# Patient Record
Sex: Male | Born: 1955 | Race: White | Hispanic: No | Marital: Married | Smoking: Former smoker
Health system: Southern US, Community
[De-identification: ages and names within clinical notes are randomized; demographics above are authoritative.]

## PROBLEM LIST (undated history)

## (undated) DIAGNOSIS — R011 Cardiac murmur, unspecified: Secondary | ICD-10-CM

## (undated) DIAGNOSIS — K429 Umbilical hernia without obstruction or gangrene: Secondary | ICD-10-CM

## (undated) DIAGNOSIS — G4730 Sleep apnea, unspecified: Secondary | ICD-10-CM

## (undated) DIAGNOSIS — Z860100 Personal history of colon polyps, unspecified: Secondary | ICD-10-CM

## (undated) DIAGNOSIS — Z8619 Personal history of other infectious and parasitic diseases: Secondary | ICD-10-CM

## (undated) DIAGNOSIS — G4733 Obstructive sleep apnea (adult) (pediatric): Secondary | ICD-10-CM

## (undated) DIAGNOSIS — Z973 Presence of spectacles and contact lenses: Secondary | ICD-10-CM

## (undated) DIAGNOSIS — H9191 Unspecified hearing loss, right ear: Secondary | ICD-10-CM

## (undated) DIAGNOSIS — R3912 Poor urinary stream: Secondary | ICD-10-CM

## (undated) DIAGNOSIS — Z87820 Personal history of traumatic brain injury: Secondary | ICD-10-CM

## (undated) DIAGNOSIS — E119 Type 2 diabetes mellitus without complications: Secondary | ICD-10-CM

## (undated) HISTORY — PX: GANGLION CYST EXCISION: SHX1691

## (undated) HISTORY — DX: Sleep apnea, unspecified: G47.30

## (undated) HISTORY — PX: ROOT CANAL: SHX2363

## (undated) HISTORY — PX: TONSILLECTOMY: SUR1361

## (undated) HISTORY — DX: Personal history of other infectious and parasitic diseases: Z86.19

## (undated) HISTORY — PX: TONSILLECTOMY AND ADENOIDECTOMY: SUR1326

## (undated) HISTORY — PX: VASECTOMY: SHX75

## (undated) HISTORY — DX: Cardiac murmur, unspecified: R01.1

---
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
## 2005-06-30 ENCOUNTER — Ambulatory Visit: Payer: Self-pay | Admitting: Family Medicine

## 2005-07-03 ENCOUNTER — Ambulatory Visit: Payer: Self-pay | Admitting: Family Medicine

## 2005-07-24 ENCOUNTER — Ambulatory Visit: Payer: Self-pay | Admitting: Gastroenterology

## 2005-08-04 ENCOUNTER — Ambulatory Visit: Payer: Self-pay | Admitting: Gastroenterology

## 2007-06-29 ENCOUNTER — Ambulatory Visit: Payer: Self-pay | Admitting: Family Medicine

## 2007-07-15 LAB — CONVERTED CEMR LAB
Alkaline Phosphatase: 72 units/L (ref 39–117)
Bilirubin, Direct: 0.1 mg/dL (ref 0.0–0.3)
CO2: 30 meq/L (ref 19–32)
GFR calc Af Amer: 90 mL/min
Glucose, Bld: 100 mg/dL — ABNORMAL HIGH (ref 70–99)
HDL: 33.1 mg/dL — ABNORMAL LOW (ref >39.0)
LDL Cholesterol: 103 mg/dL — ABNORMAL HIGH (ref 0–99)
Lymphocytes Relative: 25.6 % (ref 12.0–46.0)
Monocytes Absolute: 0.7 10*3/uL (ref 0.1–1.0)
Monocytes Relative: 11.7 % (ref 3.0–12.0)
Neutrophils Relative %: 60.5 % (ref 43.0–77.0)
Platelets: 248 10*3/uL (ref 150–400)
Potassium: 4.2 meq/L (ref 3.5–5.1)
RDW: 12.4 % (ref 11.5–14.6)
Sodium: 142 meq/L (ref 135–145)
Total CHOL/HDL Ratio: 4.5
Total Protein: 6.4 g/dL (ref 6.0–8.3)
Triglycerides: 68 mg/dL (ref 0–149)
VLDL: 14 mg/dL (ref 0–40)

## 2007-09-26 ENCOUNTER — Ambulatory Visit: Payer: Self-pay | Admitting: Family Medicine

## 2007-09-26 DIAGNOSIS — R011 Cardiac murmur, unspecified: Secondary | ICD-10-CM

## 2007-09-26 DIAGNOSIS — J309 Allergic rhinitis, unspecified: Secondary | ICD-10-CM | POA: Insufficient documentation

## 2007-09-26 DIAGNOSIS — J342 Deviated nasal septum: Secondary | ICD-10-CM

## 2007-11-04 ENCOUNTER — Encounter: Payer: Self-pay | Admitting: Family Medicine

## 2007-11-27 ENCOUNTER — Ambulatory Visit (HOSPITAL_BASED_OUTPATIENT_CLINIC_OR_DEPARTMENT_OTHER): Admission: RE | Admit: 2007-11-27 | Discharge: 2007-11-27 | Payer: Self-pay | Admitting: Otolaryngology

## 2007-12-03 ENCOUNTER — Ambulatory Visit: Payer: Self-pay | Admitting: Internal Medicine

## 2008-01-31 ENCOUNTER — Encounter: Payer: Self-pay | Admitting: Family Medicine

## 2008-08-24 ENCOUNTER — Ambulatory Visit: Payer: Self-pay | Admitting: Family Medicine

## 2008-08-24 DIAGNOSIS — M546 Pain in thoracic spine: Secondary | ICD-10-CM

## 2009-05-13 ENCOUNTER — Ambulatory Visit: Payer: Self-pay | Admitting: Family Medicine

## 2009-05-13 DIAGNOSIS — J1189 Influenza due to unidentified influenza virus with other manifestations: Secondary | ICD-10-CM | POA: Insufficient documentation

## 2009-07-08 ENCOUNTER — Telehealth: Payer: Self-pay | Admitting: Family Medicine

## 2009-12-18 ENCOUNTER — Encounter: Payer: Self-pay | Admitting: Family Medicine

## 2010-04-22 NOTE — Miscellaneous (Signed)
Summary: Flu Shot/[facility]  Flu Shot/[facility]   Imported By: Maryln Gottron 01/01/2010 12:27:04  _____________________________________________________________________  External Attachment:    Type:   Image     Comment:   External Document

## 2010-04-22 NOTE — Progress Notes (Signed)
Summary: Pt req med called in for insect bites to [facility]   Phone Note Call from Patient Call back at Work Phone [phone]   Caller: Patient Summary of Call: Pt called and says that he has insect bites all over for about 2 weeks now. Pt is req med called in to [facility] [place] [facility] [place].  Initial call taken by: Lucy Antigua,  July 08, 2009 2:07 PM  Follow-up for Phone Call        this needs to be seen and evaluated.  Try Benadryl, both orally and topically, as needed. make an OV soon Follow-up by: Nelwyn Salisbury MD,  July 08, 2009 4:55 PM  Additional Follow-up for Phone Call Additional follow up Details #1::        Phone Call Completed; [facility] Additional Follow-up by: Raechel Ache, RN,  July 08, 2009 5:03 PM

## 2010-04-22 NOTE — Assessment & Plan Note (Signed)
Summary: cough/congestion/cjr   Vital Signs:  Patient profile:   55 year old male Weight:      211 pounds Temp:     96.8 degrees F oral BP sitting:   122 / 84  (left arm) Cuff size:   large  Vitals Entered By: Alfred Levins, CMA (May 13, 2009 11:09 AM) CC: cough, congestion   History of Present Illness: Here with his 15 yr old son, who has the same symptom complex. Devarius has had 2 days of body aches, fevers, ST, and dry cough. No NVD. Drinking fluids and taking Motrin.   Allergies (verified): No Known Drug Allergies  Past History:  Past Medical History: Reviewed history from 09/26/2007 and no changes required. Allergic rhinitis Chickenpox Heart Murmur  Review of Systems  The patient denies anorexia, weight loss, weight gain, vision loss, decreased hearing, hoarseness, chest pain, syncope, dyspnea on exertion, peripheral edema, hemoptysis, abdominal pain, melena, hematochezia, severe indigestion/heartburn, hematuria, incontinence, genital sores, muscle weakness, suspicious skin lesions, transient blindness, difficulty walking, depression, unusual weight change, abnormal bleeding, enlarged lymph nodes, angioedema, breast masses, and testicular masses.    Physical Exam  General:  Well-developed,well-nourished,in no acute distress; alert,appropriate and cooperative throughout examination Head:  Normocephalic and atraumatic without obvious abnormalities. No apparent alopecia or balding. Eyes:  No corneal or conjunctival inflammation noted. EOMI. Perrla. Funduscopic exam benign, without hemorrhages, exudates or papilledema. Vision grossly normal. Ears:  External ear exam shows no significant lesions or deformities.  Otoscopic examination reveals clear canals, tympanic membranes are intact bilaterally without bulging, retraction, inflammation or discharge. Hearing is grossly normal bilaterally. Nose:  External nasal examination shows no deformity or inflammation. Nasal mucosa are  pink and moist without lesions or exudates. Mouth:  Oral mucosa and oropharynx without lesions or exudates.  Teeth in good repair. Neck:  No deformities, masses, or tenderness noted. Lungs:  Normal respiratory effort, chest expands symmetrically. Lungs are clear to auscultation, no crackles or wheezes.   Impression & Recommendations:  Problem # 1:  INFLUENZA (ICD-487.8)  Complete Medication List: 1)  Nasonex 50 Mcg/act Susp (Mometasone furoate) .... 2  sprays each nostril once daily 2)  Diclofenac Sodium 50 Mg Tbec (Diclofenac sodium) .... Three times a day as needed pain 3)  Flexeril 10 Mg Tabs (Cyclobenzaprine hcl) .... Three times a day as needed spasm 4)  Tamiflu 75 Mg Caps (Oseltamivir phosphate) .... Two times a day  Patient Instructions: 1)  Please schedule a follow-up appointment as needed .  Prescriptions: TAMIFLU 75 MG CAPS (OSELTAMIVIR PHOSPHATE) two times a day  #10 x 0   Entered and Authorized by:   Nelwyn Salisbury MD   Signed by:   Nelwyn Salisbury MD on 05/13/2009   Method used:   Electronically to        [facility] [address] [phone]* (retail)       [address]       [place], [place]  [address]       Ph: [phone] or [phone]       Fax: [phone]   RxID:   [id number]

## 2010-08-05 NOTE — Procedures (Signed)
NAMEJONANTHAN, BOLENDER             ACCOUNT NO.:  [id number]   MEDICAL RECORD NO.:  [id number]          PATIENT TYPE:  OUT   LOCATION:  SLEEP CENTER                 FACILITY:  [facility]   PHYSICIAN:  Clinton D. Maple Hudson, MD, FCCP, FACPDATE OF BIRTH:  19-Feb-1956   DATE OF STUDY:  11/27/2007                            NOCTURNAL POLYSOMNOGRAM   REFERRING PHYSICIAN:  Lucky Cowboy, MD   INDICATION FOR STUDY:  Hypersomnia with sleep apnea.   EPWORTH SLEEPINESS SCORE:  9/24.  BMI 30.4, weight 200 pounds, height 68  inches, neck 17 inches.   HOME MEDICATIONS:  Charted and reviewed.   SLEEP ARCHITECTURE:  Split study protocol.  During the diagnostic phase,  total sleep time was 104.5 minutes with sleep efficiency 42.5%.  Stage 1  was 11%.  Stage 2 was 86.6%.  Stage 3 was 2.4%.  REM was absent.  Sleep  latency was 29 minutes.  Awake after sleep onset 112.5 minutes.  Arousal  index 29.3.  No bedtime medication was taken.   RESPIRATORY DATA:  Split study protocol.  Apnea/hypopnea index (AHI),  13.8 per hour before CPAP and 24 events were scored including 3  obstructive apneas and 21 hypopneas.  The events were not positional.  CPAP was then titrated to 9 CWP, AHI 0 per hour.  He chose a small  ResMed Quattro full-face mask with heated humidifier.   OXYGEN DATA:  Moderate snoring with oxygen desaturation before CPAP to a  Nadir of 89%.  After CPAP control, mean oxygen saturation held 94.4% on  room air.   CARDIAC DATA:  Normal sinus rhythm.   MOVEMENT/PARASOMNIA:  No significant movement disturbance.  No bathroom  trips.   IMPRESSIONS/RECOMMENDATIONS:  1. Mild obstructive sleep apnea/hypopnea syndrome with apnea/hypopnea      index 13.8 per hour with nonpositional events, moderate snoring and      oxygen desaturation to a Nadir of 89%.  2. Successful constant positive air pressure titration to 9 cm of      water pressure, with apnea/hypopnea index of 0 per hour.  He chose      a small ResMed  Quattro full-face mask with heated humidifier.      Clinton D. Maple Hudson, MD, FCCP, FACP  Diplomate, Biomedical engineer of Sleep Medicine  Electronically Signed     CDY/MEDQ  D:  12/03/2007 10:52:01  T:  12/03/2007 11:54:10  Job:  [id number]

## 2010-08-07 ENCOUNTER — Other Ambulatory Visit: Payer: Self-pay | Admitting: *Deleted

## 2010-08-07 MED ORDER — FLUTICASONE PROPIONATE 50 MCG/ACT NA SUSP: 2.0000 | Freq: Every day | NASAL | Status: DC

## 2010-08-30 ENCOUNTER — Encounter: Payer: Self-pay | Admitting: Gastroenterology

## 2010-09-03 ENCOUNTER — Other Ambulatory Visit (INDEPENDENT_AMBULATORY_CARE_PROVIDER_SITE_OTHER): Payer: BC Managed Care – PPO

## 2010-09-03 DIAGNOSIS — Z0000 Encounter for general adult medical examination without abnormal findings: Secondary | ICD-10-CM

## 2010-09-03 LAB — CBC WITH DIFFERENTIAL/PLATELET
Basophils Absolute: 0.1 10*3/uL (ref 0.0–0.1)
Eosinophils Absolute: 0.1 10*3/uL (ref 0.0–0.7)
HCT: 42.8 % (ref 39.0–52.0)
Hemoglobin: 14.6 g/dL (ref 13.0–17.0)
Lymphocytes Relative: 23.3 % (ref 12.0–46.0)
Lymphs Abs: 1.8 10*3/uL (ref 0.7–4.0)
MCHC: 34 g/dL (ref 30.0–36.0)
Monocytes Absolute: 0.8 10*3/uL (ref 0.1–1.0)
Neutro Abs: 4.9 10*3/uL (ref 1.4–7.7)
RDW: 13.6 % (ref 11.5–14.6)

## 2010-09-03 LAB — BASIC METABOLIC PANEL
CO2: 30 mEq/L (ref 19–32)
Calcium: 9.2 mg/dL (ref 8.4–10.5)
Glucose, Bld: 104 mg/dL — ABNORMAL HIGH (ref 70–99)
Sodium: 142 mEq/L (ref 135–145)

## 2010-09-03 LAB — HEPATIC FUNCTION PANEL: Albumin: 4.2 g/dL (ref 3.5–5.2)

## 2010-09-03 LAB — LIPID PANEL
Cholesterol: 168 mg/dL (ref 0–200)
HDL: 38.3 mg/dL — ABNORMAL LOW (ref >39.00)
Triglycerides: 137 mg/dL (ref 0.0–149.0)
VLDL: 27.4 mg/dL (ref 0.0–40.0)

## 2010-09-03 LAB — POCT URINALYSIS DIPSTICK
Ketones, UA: NEGATIVE
Protein, UA: NEGATIVE
Spec Grav, UA: 1.02
pH, UA: 6.5

## 2010-09-03 LAB — PSA: PSA: 0.74 ng/mL (ref 0.10–4.00)

## 2010-09-08 ENCOUNTER — Encounter: Payer: Self-pay | Admitting: *Deleted

## 2010-09-11 ENCOUNTER — Encounter: Payer: Self-pay | Admitting: Family Medicine

## 2010-09-11 ENCOUNTER — Ambulatory Visit (INDEPENDENT_AMBULATORY_CARE_PROVIDER_SITE_OTHER): Payer: BC Managed Care – PPO | Admitting: Family Medicine

## 2010-09-11 VITALS — BP 120/80 | HR 72 | Temp 98.8°F | Ht 67.0 in | Wt 215.0 lb

## 2010-09-11 DIAGNOSIS — Z136 Encounter for screening for cardiovascular disorders: Secondary | ICD-10-CM

## 2010-09-11 DIAGNOSIS — Z0000 Encounter for general adult medical examination without abnormal findings: Secondary | ICD-10-CM

## 2010-09-11 DIAGNOSIS — Z23 Encounter for immunization: Secondary | ICD-10-CM

## 2010-09-11 MED ORDER — TERBINAFINE HCL 250 MG PO TABS: 250.0000 mg | ORAL_TABLET | Freq: Every day | ORAL | Status: AC

## 2010-09-11 MED ORDER — MOMETASONE FUROATE 50 MCG/ACT NA SUSP: 2.0000 | Freq: Every day | NASAL | Status: DC

## 2010-09-11 MED ORDER — OLOPATADINE HCL 0.6 % NA SOLN: 2.0000 | Freq: Two times a day (BID) | NASAL | Status: DC

## 2010-09-11 NOTE — Progress Notes (Signed)
  Subjective:    Patient ID: Jack Pitts, male    DOB: 1956/03/08, 55 y.o.   MRN: [id number]  HPI 55 yr old male for a cpx. He feels well although he would like to get rid of toenail fungus he has had for the past 9 months. He is in the process of setting up another colonoscopy.   Review of Systems  Constitutional: Negative.   HENT: Negative.   Eyes: Negative.   Respiratory: Negative.   Cardiovascular: Negative.   Gastrointestinal: Negative.   Genitourinary: Negative.   Musculoskeletal: Negative.   Skin: Negative.   Neurological: Negative.   Hematological: Negative.   Psychiatric/Behavioral: Negative.        Objective:   Physical Exam  Constitutional: He is oriented to person, place, and time. He appears well-developed and well-nourished. No distress.  HENT:  Head: Normocephalic and atraumatic.  Right Ear: External ear normal.  Left Ear: External ear normal.  Nose: Nose normal.  Mouth/Throat: Oropharynx is clear and moist. No oropharyngeal exudate.  Eyes: Conjunctivae and EOM are normal. Pupils are equal, round, and reactive to light. Right eye exhibits no discharge. Left eye exhibits no discharge. No scleral icterus.  Neck: Neck supple. No JVD present. No tracheal deviation present. No thyromegaly present.  Cardiovascular: Normal rate, regular rhythm, normal heart sounds and intact distal pulses.  Exam reveals no gallop and no friction rub.   No murmur heard.      EKG normal  Pulmonary/Chest: Effort normal and breath sounds normal. No respiratory distress. He has no wheezes. He has no rales. He exhibits no tenderness.  Abdominal: Soft. Bowel sounds are normal. He exhibits no distension and no mass. There is no tenderness. There is no rebound and no guarding.  Genitourinary: Rectum normal, prostate normal and penis normal. Guaiac negative stool. No penile tenderness.  Musculoskeletal: Normal range of motion. He exhibits no edema and no tenderness.  Lymphadenopathy:   He has no cervical adenopathy.  Neurological: He is alert and oriented to person, place, and time. He has normal reflexes. No cranial nerve deficit. He exhibits normal muscle tone. Coordination normal.  Skin: Skin is warm and dry. No rash noted. He is not diaphoretic. No erythema. No pallor.  Psychiatric: He has a normal mood and affect. His behavior is normal. Judgment and thought content normal.          Assessment & Plan:  Exercise and lose some weight

## 2011-11-09 ENCOUNTER — Other Ambulatory Visit: Payer: Self-pay | Admitting: Family Medicine

## 2011-11-09 NOTE — Telephone Encounter (Signed)
Can we refill this? 

## 2011-11-11 ENCOUNTER — Other Ambulatory Visit: Payer: Self-pay | Admitting: Family Medicine

## 2011-11-11 NOTE — Telephone Encounter (Signed)
Okay for one year  

## 2011-12-10 ENCOUNTER — Encounter: Payer: Self-pay | Admitting: Gastroenterology

## 2012-07-31 ENCOUNTER — Ambulatory Visit (HOSPITAL_BASED_OUTPATIENT_CLINIC_OR_DEPARTMENT_OTHER): Payer: BC Managed Care – PPO | Attending: Otolaryngology | Admitting: Radiology

## 2012-07-31 VITALS — Ht 67.0 in | Wt 220.0 lb

## 2012-07-31 DIAGNOSIS — G4733 Obstructive sleep apnea (adult) (pediatric): Secondary | ICD-10-CM

## 2012-07-31 DIAGNOSIS — G4710 Hypersomnia, unspecified: Secondary | ICD-10-CM | POA: Insufficient documentation

## 2012-07-31 DIAGNOSIS — G4730 Sleep apnea, unspecified: Secondary | ICD-10-CM | POA: Insufficient documentation

## 2012-08-06 DIAGNOSIS — G4710 Hypersomnia, unspecified: Secondary | ICD-10-CM

## 2012-08-06 DIAGNOSIS — G4730 Sleep apnea, unspecified: Secondary | ICD-10-CM

## 2012-08-06 NOTE — Procedures (Signed)
NAMESTARR, Jack Pitts             ACCOUNT NO.:  [id number]  MEDICAL RECORD NO.:  [id number]          PATIENT TYPE:  OUT  LOCATION:  SLEEP CENTER                 FACILITY[facility]  PHYSICIAN:  Whittney Steenson D. Maple Hudson, MD, FCCP, FACPDATE OF BIRTH:  1955/09/06  DATE OF STUDY:  07/31/2012                           NOCTURNAL POLYSOMNOGRAM  REFERRING PHYSICIAN:  Jefry H. Pollyann Kennedy, MD  INDICATION FOR STUDY:  Hypersomnia with sleep apnea.  EPWORTH SLEEPINESS SCORE:  4/24.  BMI 30.4, weight 200 pounds.  Height 68 inches, neck 16.5 inches.  MEDICATIONS:  Home medications are charted and reviewed.  A baseline diagnostic NPSG on November 27, 2007 had recorded AHI 13.8 per hour.  CPAP was then titrated to 9 CWP for an AHI of 0 per hour. Body weight was 200 pounds.  CPAP titration is now requested.  SLEEP ARCHITECTURE:  Total sleep time 343.5 minutes with sleep efficiency 94.8%.  Stage I was 2.5%, stage II 83.8%, stage III 1.7%, REM 11.9% of total sleep time.  Sleep latency 8.5 minutes, REM latency 56.5 minutes, awake after sleep onset 10.5 minutes.  Arousal index 10.7. Bedtime medication:  None.  RESPIRATORY DATA:  CPAP titration protocol.  CPAP was titrated to 13 CWP, AHI 0 per hour.  Pressure was increased full audible snoring.  He wore a medium ResMed AirFit F10 full-face mask with heated humidifier.  OXYGEN DATA:  Snoring was prevented and mean oxygen saturation held 95.2% with CPAP.  CARDIAC DATA:  Sinus rhythm with occasional PVC.  MOVEMENT-PARASOMNIA:  No significant movement disturbance.  No bathroom trips.  IMPRESSIONS-RECOMMENDATIONS: 1. Successful CPAP titration to 13 CWP, AHI 0 per hour.  He wore a     medium ResMed AirFit F10 full-face mask with heated humidifier.     Snoring was prevented and mean oxygen saturation held 95.2% on room     air. 2. Baseline diagnostic NPSG on November 27, 2007 had recorded AHI 13.8     per hour.  CPAP titration at that time to 9 CWP, AHI 0 per  hour.     Body weight was 200 pounds.    Hayli Milligan D. Maple Hudson, MD, [facility], FACP Diplomate, American Board of Sleep Medicine   CDY/MEDQ  D:  08/06/2012 09:49:36  T:  08/06/2012 13:38:22  Job:  [id number]

## 2012-11-12 ENCOUNTER — Other Ambulatory Visit: Payer: Self-pay | Admitting: Family Medicine

## 2012-12-12 ENCOUNTER — Other Ambulatory Visit: Payer: Self-pay | Admitting: Family Medicine

## 2013-01-13 ENCOUNTER — Ambulatory Visit (INDEPENDENT_AMBULATORY_CARE_PROVIDER_SITE_OTHER): Payer: BC Managed Care – PPO

## 2013-01-13 DIAGNOSIS — Z23 Encounter for immunization: Secondary | ICD-10-CM

## 2013-01-16 ENCOUNTER — Other Ambulatory Visit: Payer: Self-pay | Admitting: Family Medicine

## 2013-04-17 ENCOUNTER — Ambulatory Visit (INDEPENDENT_AMBULATORY_CARE_PROVIDER_SITE_OTHER): Payer: BC Managed Care – PPO | Admitting: Family Medicine

## 2013-04-17 ENCOUNTER — Encounter: Payer: Self-pay | Admitting: Family Medicine

## 2013-04-17 VITALS — BP 124/80 | HR 78 | Temp 98.8°F

## 2013-04-17 DIAGNOSIS — Z86010 Personal history of colonic polyps: Secondary | ICD-10-CM

## 2013-04-17 DIAGNOSIS — S93609A Unspecified sprain of unspecified foot, initial encounter: Secondary | ICD-10-CM

## 2013-04-17 DIAGNOSIS — S93601A Unspecified sprain of right foot, initial encounter: Secondary | ICD-10-CM

## 2013-04-17 DIAGNOSIS — S93409A Sprain of unspecified ligament of unspecified ankle, initial encounter: Secondary | ICD-10-CM

## 2013-04-17 MED ORDER — OLOPATADINE HCL 0.6 % NA SOLN: NASAL | Status: DC

## 2013-04-17 MED ORDER — MOMETASONE FUROATE 50 MCG/ACT NA SUSP: 2.0000 | Freq: Every day | NASAL | Status: DC

## 2013-04-17 NOTE — Progress Notes (Signed)
Pre visit review using our clinic review tool, if applicable. No additional management support is needed unless otherwise documented below in the visit note. 

## 2013-04-18 ENCOUNTER — Ambulatory Visit (HOSPITAL_COMMUNITY)
Admission: RE | Admit: 2013-04-18 | Discharge: 2013-04-18 | Disposition: A | Discharge status: Home / Self Care | Payer: BC Managed Care – PPO | Source: Ambulatory Visit | Attending: Family Medicine | Admitting: Family Medicine

## 2013-04-18 ENCOUNTER — Encounter: Payer: Self-pay | Admitting: Family Medicine

## 2013-04-18 DIAGNOSIS — S8990XA Unspecified injury of unspecified lower leg, initial encounter: Secondary | ICD-10-CM | POA: Insufficient documentation

## 2013-04-18 DIAGNOSIS — S93409A Sprain of unspecified ligament of unspecified ankle, initial encounter: Secondary | ICD-10-CM

## 2013-04-18 DIAGNOSIS — S99929A Unspecified injury of unspecified foot, initial encounter: Principal | ICD-10-CM

## 2013-04-18 DIAGNOSIS — X58XXXA Exposure to other specified factors, initial encounter: Secondary | ICD-10-CM | POA: Insufficient documentation

## 2013-04-18 DIAGNOSIS — M898X9 Other specified disorders of bone, unspecified site: Secondary | ICD-10-CM | POA: Insufficient documentation

## 2013-04-18 DIAGNOSIS — S93601A Unspecified sprain of right foot, initial encounter: Secondary | ICD-10-CM

## 2013-04-18 DIAGNOSIS — S99919A Unspecified injury of unspecified ankle, initial encounter: Principal | ICD-10-CM

## 2013-04-18 NOTE — Progress Notes (Signed)
   Subjective:    Patient ID: Jack Pitts, male    DOB: 12/25/1955, 58 y.o.   MRN: [id number]  HPI Here for several injuries to the right foot and ankle in the past 2 weeks. First he was playing soccer with his son and he landed on an uneven piece of ground when he felt a sharp pain in the lateral right foot. Then several days ago he was running again and felt a "pop" and sudden sharp pain in the lateral right ankle. There was mild swelling. He has used ice but has taken no meds for this. The pain has persisted for several days.    Review of Systems  Constitutional: Negative.   Musculoskeletal: Positive for arthralgias and joint swelling.       Objective:   Physical Exam  Constitutional: He appears well-developed and well-nourished.  Walks with a mild limp   Musculoskeletal:  The right lateral ankle is mildly swollen over the malleolus and just distal to this. He is very tender just inferior to the lateral malleolus. He is mildly tender along the lateral foot. Full ROM          Assessment & Plan:  This appears to be a bad sprain but we need to rule out fractures. He will support the ankle with a lace up brace. Use ice and Ibuprofen. We will get Xrays tomorrow.

## 2013-05-26 ENCOUNTER — Encounter: Payer: Self-pay | Admitting: Family Medicine

## 2013-09-02 ENCOUNTER — Encounter (HOSPITAL_COMMUNITY): Payer: Self-pay | Admitting: Emergency Medicine

## 2013-09-02 ENCOUNTER — Emergency Department (INDEPENDENT_AMBULATORY_CARE_PROVIDER_SITE_OTHER)
Admission: EM | Admit: 2013-09-02 | Discharge: 2013-09-02 | Disposition: A | Discharge status: Home / Self Care | Payer: BC Managed Care – PPO | Source: Home / Self Care | Attending: Emergency Medicine | Admitting: Emergency Medicine

## 2013-09-02 DIAGNOSIS — K0889 Other specified disorders of teeth and supporting structures: Secondary | ICD-10-CM

## 2013-09-02 DIAGNOSIS — K089 Disorder of teeth and supporting structures, unspecified: Secondary | ICD-10-CM

## 2013-09-02 MED ORDER — NAPROXEN 500 MG PO TABS: 500.0000 mg | ORAL_TABLET | Freq: Two times a day (BID) | ORAL | Status: DC

## 2013-09-02 NOTE — ED Provider Notes (Signed)
CSN: [id number]     Arrival date & time 09/02/13  1644 History   First MD Initiated Contact with Patient 09/02/13 1712     Chief Complaint  Patient presents with  . Dental Pain   (Consider location/radiation/quality/duration/timing/severity/associated sxs/prior Treatment) Patient is a 58 y.o. male presenting with tooth pain. The history is provided by the patient.  Dental Pain Location:  Lower Lower teeth location:  17/LL 3rd molar Quality:  Aching and shooting Severity:  Moderate Onset quality:  Gradual Duration:  3 days Timing:  Constant Progression:  Worsening Chronicity:  New Context comment:  States he was told by his dentist at his last visit that involved tooth has a crack in it Associated symptoms: facial pain and headaches   Associated symptoms: no congestion, no difficulty swallowing, no facial swelling, no fever, no gum swelling, no neck pain, no neck swelling, no oral bleeding, no oral lesions and no trismus     Past Medical History  Diagnosis Date  . Allergic rhinitis   . History of chickenpox   . Heart murmur    Past Surgical History  Procedure Laterality Date  . Ganglion cyst excision      right wrist 1969  . Tonsillectomy    . Vasectomy    . Colonoscopy  08-07-15    per Dr. Deatra Ina, repeat in 5 yrs   Family History  Problem Relation Age of Onset  . Alcohol abuse    . Arthritis    . Coronary artery disease    . Diabetes    . Hyperlipidemia    . Kidney disease    . Prostate cancer    . Sudden death    . Lymphoma Brother    History  Substance Use Topics  . Smoking status: Never Smoker   . Smokeless tobacco: Never Used  . Alcohol Use: Yes     Comment: occ    Review of Systems  Constitutional: Negative for fever.  HENT: Negative for congestion, facial swelling and mouth sores.   Musculoskeletal: Negative for neck pain.  Neurological: Positive for headaches.  All other systems reviewed and are negative.   Allergies  Review of patient's  allergies indicates no known allergies.  Home Medications   Prior to Admission medications   Medication Sig Start Date End Date Taking? Authorizing Provider  mometasone (NASONEX) 50 MCG/ACT nasal spray Place 2 sprays into the nose daily. 04/17/13   Laurey Morale, MD  naproxen (NAPROSYN) 500 MG tablet Take 1 tablet (500 mg total) by mouth 2 (two) times daily. As needed for pain 09/02/13   Annett Gula Ayad Nieman, PA  Olopatadine HCl (PATANASE) 0.6 % SOLN INSTILL 2 SPRAYS INTO THE NOSE TWICE DAILY 04/17/13   Laurey Morale, MD   BP 143/85  Pulse 75  Temp(Src) 98.4 F (36.9 C) (Oral)  Resp 18  SpO2 97% Physical Exam  Nursing note and vitals reviewed. Constitutional: He is oriented to person, place, and time. He appears well-developed and well-nourished. No distress.  HENT:  Head: Normocephalic and atraumatic.  Right Ear: Hearing and external ear normal.  Left Ear: Hearing and external ear normal.  Nose: Nose normal.  Mouth/Throat:    Eyes: Conjunctivae are normal.  Neck: Normal range of motion. Neck supple.  Cardiovascular: Normal rate.   Pulmonary/Chest: Effort normal. No stridor.  Lymphadenopathy:    He has no cervical adenopathy.  Neurological: He is alert and oriented to person, place, and time.  Skin: Skin is warm and dry.  Psychiatric:  He has a normal mood and affect. His behavior is normal.    ED Course  Procedures (including critical care time) Labs Review Labs Reviewed - No data to display  Imaging Review No results found.   MDM   1. Pain, dental    No clinical indication of dental abscess. Will treat with naprosyn 500mg  po BID and advise follow up with his dental provider on 09/04/2013.   [place], [place] 09/02/13 [phone]

## 2013-09-02 NOTE — ED Provider Notes (Signed)
Medical screening examination/treatment/procedure(s) were performed by non-physician practitioner and as supervising physician I was immediately available for consultation/collaboration.  Philipp Deputy, M.D.  Harden Mo, MD 09/02/13 [phone]

## 2013-09-02 NOTE — Discharge Instructions (Signed)

## 2013-09-02 NOTE — ED Notes (Signed)
Pt  Reports  l  Lower  toothache   X  3  Days  He  Reports  He  Has  A  Dentist  But it is the  Weekend

## 2013-09-19 ENCOUNTER — Encounter: Payer: Self-pay | Admitting: Gastroenterology

## 2013-09-27 ENCOUNTER — Ambulatory Visit (AMBULATORY_SURGERY_CENTER): Payer: Self-pay

## 2013-09-27 VITALS — Ht 68.0 in | Wt 214.0 lb

## 2013-09-27 DIAGNOSIS — Z860100 Personal history of colon polyps, unspecified: Secondary | ICD-10-CM

## 2013-09-27 MED ORDER — SUPREP BOWEL PREP KIT 17.5-3.13-1.6 GM/177ML PO SOLN: 1.0000 | Freq: Once | ORAL | Status: DC

## 2013-09-27 NOTE — Progress Notes (Signed)
No allergies to eggs or soy No home oxygen No past problems with anesthesia No relatives with problems with anesthesia No diet/weight loss meds  Has email  Emmi instructions given for colonoscopy

## 2013-10-04 ENCOUNTER — Encounter: Payer: Self-pay | Admitting: Gastroenterology

## 2013-10-11 ENCOUNTER — Ambulatory Visit (AMBULATORY_SURGERY_CENTER): Payer: BC Managed Care – PPO | Admitting: Gastroenterology

## 2013-10-11 ENCOUNTER — Encounter: Payer: Self-pay | Admitting: Gastroenterology

## 2013-10-11 ENCOUNTER — Encounter: Payer: BC Managed Care – PPO | Admitting: Gastroenterology

## 2013-10-11 VITALS — BP 105/61 | HR 65 | Temp 98.1°F | Resp 16 | Ht 68.0 in | Wt 214.0 lb

## 2013-10-11 DIAGNOSIS — Z860100 Personal history of colon polyps, unspecified: Secondary | ICD-10-CM

## 2013-10-11 DIAGNOSIS — D126 Benign neoplasm of colon, unspecified: Secondary | ICD-10-CM

## 2013-10-11 HISTORY — PX: COLONOSCOPY: SHX174

## 2013-10-11 MED ORDER — SODIUM CHLORIDE 0.9 % IV SOLN: 500.0000 mL | INTRAVENOUS | Status: DC

## 2013-10-11 NOTE — Progress Notes (Signed)
Called to room to assist during endoscopic procedure.  Patient ID and intended procedure confirmed with present staff. Received instructions for my participation in the procedure from the performing physician.  

## 2013-10-11 NOTE — Patient Instructions (Signed)
YOU HAD AN ENDOSCOPIC PROCEDURE TODAY AT THE [facility]: Refer to the procedure report that was given to you for any specific questions about what was found during the examination.  If the procedure report does not answer your questions, please call your gastroenterologist to clarify.  If you requested that your care partner not be given the details of your procedure findings, then the procedure report has been included in a sealed envelope for you to review at your convenience later.  YOU SHOULD EXPECT: Some feelings of bloating in the abdomen. Passage of more gas than usual.  Walking can help get rid of the air that was put into your GI tract during the procedure and reduce the bloating. If you had a lower endoscopy (such as a colonoscopy or flexible sigmoidoscopy) you may notice spotting of blood in your stool or on the toilet paper. If you underwent a bowel prep for your procedure, then you may not have a normal bowel movement for a few days.  DIET: Your first meal following the procedure should be a light meal and then it is ok to progress to your normal diet.  A half-sandwich or bowl of soup is an example of a good first meal.  Heavy or fried foods are harder to digest and may make you feel nauseous or bloated.  Likewise meals heavy in dairy and vegetables can cause extra gas to form and this can also increase the bloating.  Drink plenty of fluids but you should avoid alcoholic beverages for 24 hours.  ACTIVITY: Your care partner should take you home directly after the procedure.  You should plan to take it easy, moving slowly for the rest of the day.  You can resume normal activity the day after the procedure however you should NOT DRIVE or use heavy machinery for 24 hours (because of the sedation medicines used during the test).    SYMPTOMS TO REPORT IMMEDIATELY: A gastroenterologist can be reached at any hour.  During normal business hours, 8:30 AM to 5:00 PM Monday through Friday,  call [phone].  After hours and on weekends, please call the GI answering service at [phone] who will take a message and have the physician on call contact you.   Following lower endoscopy (colonoscopy or flexible sigmoidoscopy):  Excessive amounts of blood in the stool  Significant tenderness or worsening of abdominal pains  Swelling of the abdomen that is new, acute  Fever of 100F or higher    FOLLOW UP: If any biopsies were taken you will be contacted by phone or by letter within the next 1-3 weeks.  Call your gastroenterologist if you have not heard about the biopsies in 3 weeks.  Our staff will call the home number listed on your records the next business day following your procedure to check on you and address any questions or concerns that you may have at that time regarding the information given to you following your procedure. This is a courtesy call and so if there is no answer at the home number and we have not heard from you through the emergency physician on call, we will assume that you have returned to your regular daily activities without incident.  SIGNATURES/CONFIDENTIALITY: You and/or your care partner have signed paperwork which will be entered into your electronic medical record.  These signatures attest to the fact that that the information above on your After Visit Summary has been reviewed and is understood.  Full responsibility of the confidentiality   this discharge information lies with you and/or your care-partner.   INFORMATION ON POLYPS GIVEN TO YOU TODAY 

## 2013-10-11 NOTE — Op Note (Signed)
[place]  [address] [place], [id number]   COLONOSCOPY PROCEDURE REPORT  PATIENT: Jack Pitts, Jack Pitts  MR#: [id number] BIRTHDATE: May 03, 1955 , 54  yrs. old GENDER: Male ENDOSCOPIST: Inda Castle, MD REFERRED BY: PROCEDURE DATE:  10/11/2013 PROCEDURE:   Colonoscopy with snare polypectomy and Colonoscopy with cold biopsy polypectomy First Screening Colonoscopy - Avg.  risk and is 50 yrs.  old or older - No.  Prior Negative Screening - Now for repeat screening. N/A  History of Adenoma - Now for follow-up colonoscopy & has been > or = to 3 yrs.  Yes hx of adenoma.  Has been 3 or more years since last colonoscopy.  Polyps Removed Today? Yes. ASA CLASS:   Class II INDICATIONS:Patient's personal history of adenomatous colon polyps. 2007 MEDICATIONS: MAC sedation, administered by CRNA and propofol (Diprivan) 350mg  IV  DESCRIPTION OF PROCEDURE:   After the risks benefits and alternatives of the procedure were thoroughly explained, informed consent was obtained.  A digital rectal exam revealed no abnormalities of the rectum.   The [id number]  endoscope was introduced through the anus and advanced to the cecum, which was identified by both the appendix and ileocecal valve. No adverse events experienced.   The quality of the prep was excellent using Suprep  The instrument was then slowly withdrawn as the colon was fully examined.      COLON FINDINGS: A flat polyp measuring 3 mm in size was found in the descending colon.  A polypectomy was performed with a cold snare and with cold forceps.  The resection was complete and the polyp tissue was completely retrieved.   A sessile polyp measuring 2 mm in size was found in the sigmoid colon.  A polypectomy was performed with cold forceps.   The colon was otherwise normal. There was no diverticulosis, inflammation, polyps or cancers unless previously stated.  Retroflexed views revealed no abnormalities. The  time to cecum=5 minutes 36 seconds.  Withdrawal time=11 minutes 58 seconds.  The scope was withdrawn and the procedure completed. COMPLICATIONS: There were no complications.  ENDOSCOPIC IMPRESSION: 1.   Flat polyp measuring 3 mm in size was found in the descending colon; polypectomy was performed with a cold snare and with cold forceps 2.   Sessile polyp measuring 2 mm in size was found in the sigmoid colon; polypectomy was performed with cold forceps 3.   The colon was otherwise normal  RECOMMENDATIONS: If the polyp(s) removed today are proven to be adenomatous (pre-cancerous) polyps, you will need a repeat colonoscopy in 5 years.  Otherwise you should continue to follow colorectal cancer screening guidelines for "routine risk" patients with colonoscopy in 10 years.  You will receive a letter within 1-2 weeks with the results of your biopsy as well as final recommendations.  Please call my office if you have not received a letter after 3 weeks.   eSigned:  Inda Castle, MD 10/11/2013 2:33 PM   cc: Laurey Morale, MD   PATIENT NAME:  Jack Pitts, Jack Pitts MR#: [id number]

## 2013-10-12 ENCOUNTER — Telehealth: Payer: Self-pay | Admitting: *Deleted

## 2013-10-12 NOTE — Telephone Encounter (Signed)
Attempted to call pt. At [phone],but voicemail box has not been set up yet.

## 2013-10-18 ENCOUNTER — Encounter: Payer: Self-pay | Admitting: Gastroenterology

## 2013-12-19 ENCOUNTER — Telehealth: Payer: Self-pay | Admitting: Family Medicine

## 2013-12-19 NOTE — Telephone Encounter (Signed)
Pt would like to get an alternate to mometasone (NASONEX) 50 MCG/ACT nasal spray It has been out of stock at pharm for over a month and he needs something.  [facility] [address]

## 2013-12-19 NOTE — Telephone Encounter (Signed)
These are all OTC now. Try Flonase for instance

## 2013-12-20 NOTE — Telephone Encounter (Signed)
I spoke with pt  

## 2013-12-21 ENCOUNTER — Encounter: Payer: Self-pay | Admitting: Gastroenterology

## 2014-04-15 ENCOUNTER — Emergency Department (HOSPITAL_COMMUNITY)
Admission: EM | Admit: 2014-04-15 | Discharge: 2014-04-15 | Disposition: A | Discharge status: Home / Self Care | Payer: BLUE CROSS/BLUE SHIELD | Attending: Emergency Medicine | Admitting: Emergency Medicine

## 2014-04-15 ENCOUNTER — Encounter (HOSPITAL_COMMUNITY): Payer: Self-pay | Admitting: Emergency Medicine

## 2014-04-15 DIAGNOSIS — Y9289 Other specified places as the place of occurrence of the external cause: Secondary | ICD-10-CM | POA: Diagnosis not present

## 2014-04-15 DIAGNOSIS — L089 Local infection of the skin and subcutaneous tissue, unspecified: Secondary | ICD-10-CM | POA: Diagnosis present

## 2014-04-15 DIAGNOSIS — Z8619 Personal history of other infectious and parasitic diseases: Secondary | ICD-10-CM | POA: Diagnosis not present

## 2014-04-15 DIAGNOSIS — S91002A Unspecified open wound, left ankle, initial encounter: Secondary | ICD-10-CM | POA: Diagnosis not present

## 2014-04-15 DIAGNOSIS — Z7951 Long term (current) use of inhaled steroids: Secondary | ICD-10-CM | POA: Diagnosis not present

## 2014-04-15 DIAGNOSIS — W228XXA Striking against or struck by other objects, initial encounter: Secondary | ICD-10-CM | POA: Diagnosis not present

## 2014-04-15 DIAGNOSIS — Y998 Other external cause status: Secondary | ICD-10-CM | POA: Insufficient documentation

## 2014-04-15 DIAGNOSIS — I10 Essential (primary) hypertension: Secondary | ICD-10-CM | POA: Insufficient documentation

## 2014-04-15 DIAGNOSIS — Z791 Long term (current) use of non-steroidal anti-inflammatories (NSAID): Secondary | ICD-10-CM | POA: Insufficient documentation

## 2014-04-15 DIAGNOSIS — Y9389 Activity, other specified: Secondary | ICD-10-CM | POA: Insufficient documentation

## 2014-04-15 DIAGNOSIS — Z8709 Personal history of other diseases of the respiratory system: Secondary | ICD-10-CM | POA: Diagnosis not present

## 2014-04-15 DIAGNOSIS — R739 Hyperglycemia, unspecified: Secondary | ICD-10-CM | POA: Insufficient documentation

## 2014-04-15 DIAGNOSIS — Z23 Encounter for immunization: Secondary | ICD-10-CM | POA: Diagnosis not present

## 2014-04-15 DIAGNOSIS — R011 Cardiac murmur, unspecified: Secondary | ICD-10-CM | POA: Insufficient documentation

## 2014-04-15 LAB — CBG MONITORING, ED: Glucose-Capillary: 131 mg/dL — ABNORMAL HIGH (ref 70–99)

## 2014-04-15 MED ORDER — CLINDAMYCIN HCL 300 MG PO CAPS
300.0000 mg | ORAL_CAPSULE | Freq: Once | ORAL | Status: AC
  Administered 2014-04-15: 300 mg via ORAL
  Filled 2014-04-15: qty 1

## 2014-04-15 MED ORDER — TETANUS-DIPHTH-ACELL PERTUSSIS 5-2.5-18.5 LF-MCG/0.5 IM SUSP
0.5000 mL | Freq: Once | INTRAMUSCULAR | Status: AC
  Administered 2014-04-15: 0.5 mL via INTRAMUSCULAR
  Filled 2014-04-15: qty 0.5

## 2014-04-15 MED ORDER — CLINDAMYCIN HCL 300 MG PO CAPS: 300.0000 mg | ORAL_CAPSULE | Freq: Three times a day (TID) | ORAL | Status: DC

## 2014-04-15 NOTE — ED Notes (Signed)
Hit lower left leg with wood; red wound, swollen ankle area. Sore to touch.

## 2014-04-15 NOTE — Discharge Instructions (Signed)
Take clinda for a week.   Take tylenol, motrin for pain.   Your sugar is slightly elevated. You should see your doctor to get checked for diabetes.   Apply neosporin to the wound. Keep it clean and dry.   Return to ER if you have fever, purulent discharge from the wound, worse redness.

## 2014-04-15 NOTE — ED Provider Notes (Signed)
CSN: [id number]     Arrival date & time 04/15/14  0906 History   First MD Initiated Contact with Patient 04/15/14 [phone]     Chief Complaint  Patient presents with  . Wound Infection     (Consider location/radiation/quality/duration/timing/severity/associated sxs/prior Treatment) The history is provided by the patient.  Jack Pitts is a 59 y.o. male hx of HTN, pre diabetes here with left leg wound. He was chopping wood 4 days ago and the wood accidentally hit his left ankle area. He was wearing some pants so denies any foreign bodies in his ankle. He had an abrasion there but progressively got more swollen and red. Denies any purulent drainage or fevers. He tried to clean it today but got more red.   Past Medical History  Diagnosis Date  . Allergic rhinitis   . History of chickenpox   . Heart murmur    Past Surgical History  Procedure Laterality Date  . Ganglion cyst excision      right wrist 1969  . Tonsillectomy    . Vasectomy    . Colonoscopy  08-07-15    per Dr. Deatra Ina, repeat in 5 yrs   Family History  Problem Relation Age of Onset  . Alcohol abuse    . Arthritis    . Coronary artery disease    . Diabetes    . Hyperlipidemia    . Kidney disease    . Prostate cancer    . Sudden death    . Lymphoma Brother   . Colon cancer Neg Hx   . Pancreatic cancer Neg Hx   . Rectal cancer Neg Hx   . Stomach cancer Neg Hx    History  Substance Use Topics  . Smoking status: Never Smoker   . Smokeless tobacco: Never Used  . Alcohol Use: Yes     Comment: occ    Review of Systems  Skin: Positive for wound.  All other systems reviewed and are negative.     Allergies  Review of patient's allergies indicates no known allergies.  Home Medications   Prior to Admission medications   Medication Sig Start Date End Date Taking? Authorizing Provider  naproxen (NAPROSYN) 500 MG tablet Take 1 tablet (500 mg total) by mouth 2 (two) times daily. As needed for pain 09/02/13   Yes Annett Gula H Presson, PA  mometasone (NASONEX) 50 MCG/ACT nasal spray Place 2 sprays into the nose daily. Patient not taking: Reported on 04/15/2014 04/17/13   Laurey Morale, MD   BP 126/77 mmHg  Pulse 86  Temp(Src) 98.3 F (36.8 C) (Oral)  Resp 20  Ht 5\' 7"  (1.702 m)  Wt 200 lb (90.719 kg)  BMI 31.32 kg/m2  SpO2 98% Physical Exam  Constitutional: He is oriented to person, place, and time. He appears well-developed and well-nourished.  HENT:  Head: Normocephalic.  Eyes: Conjunctivae and EOM are normal. Pupils are equal, round, and reactive to light.  Neck: Normal range of motion.  Cardiovascular: Normal rate.   Pulmonary/Chest: Effort normal.  Abdominal: Soft.  Musculoskeletal:  L distal leg with 2 in area of abrasion. No obvious foreign body palpable. No purulent discharge. 2+ pulses. ? Some redness around it   Neurological: He is alert and oriented to person, place, and time.  Skin: Skin is dry.  See above   Psychiatric: He has a normal mood and affect. His behavior is normal. Judgment and thought content normal.  Nursing note and vitals reviewed.   ED Course  Procedures (including critical care time) Labs Review Labs Reviewed  CBG MONITORING, ED - Abnormal; Notable for the following:    Glucose-Capillary 131 (*)    All other components within normal limits    Imaging Review No results found.   EKG Interpretation None      MDM   Final diagnoses:  None    Jack Pitts is a 59 y.o. male here with left leg wound. Likely local reaction to the injury. Tdap updated in the ED. No obvious foreign body in the wound. May have early infection. CBG 131 and I think patient likely having pre diabetes. Will give prophylactic abx with clinda.     Wandra Arthurs, MD 04/15/14 [phone]

## 2014-04-15 NOTE — ED Notes (Signed)
Patient CBG was 131.

## 2014-09-19 ENCOUNTER — Other Ambulatory Visit (INDEPENDENT_AMBULATORY_CARE_PROVIDER_SITE_OTHER): Payer: BC Managed Care – PPO

## 2014-09-19 DIAGNOSIS — Z0000 Encounter for general adult medical examination without abnormal findings: Secondary | ICD-10-CM | POA: Diagnosis not present

## 2014-09-19 LAB — HEPATIC FUNCTION PANEL
ALT: 20 U/L (ref 0–53)
AST: 15 U/L (ref 0–37)
Albumin: 4.1 g/dL (ref 3.5–5.2)
Alkaline Phosphatase: 73 U/L (ref 39–117)
BILIRUBIN DIRECT: 0.1 mg/dL (ref 0.0–0.3)
BILIRUBIN TOTAL: 0.5 mg/dL (ref 0.2–1.2)
Total Protein: 6.8 g/dL (ref 6.0–8.3)

## 2014-09-19 LAB — CBC WITH DIFFERENTIAL/PLATELET
Basophils Absolute: 0 10*3/uL (ref 0.0–0.1)
Basophils Relative: 0.6 % (ref 0.0–3.0)
EOS ABS: 0.1 10*3/uL (ref 0.0–0.7)
Eosinophils Relative: 1.5 % (ref 0.0–5.0)
HCT: 43.5 % (ref 39.0–52.0)
HEMOGLOBIN: 14.8 g/dL (ref 13.0–17.0)
LYMPHS PCT: 21.8 % (ref 12.0–46.0)
Lymphs Abs: 1.5 10*3/uL (ref 0.7–4.0)
MCHC: 34 g/dL (ref 30.0–36.0)
MCV: 90 fl (ref 78.0–100.0)
Monocytes Absolute: 0.7 10*3/uL (ref 0.1–1.0)
Monocytes Relative: 11 % (ref 3.0–12.0)
Neutro Abs: 4.4 10*3/uL (ref 1.4–7.7)
Neutrophils Relative %: 65.1 % (ref 43.0–77.0)
Platelets: 229 10*3/uL (ref 150.0–400.0)
RBC: 4.83 Mil/uL (ref 4.22–5.81)
RDW: 13.3 % (ref 11.5–15.5)
WBC: 6.8 10*3/uL (ref 4.0–10.5)

## 2014-09-19 LAB — PSA: PSA: 0.75 ng/mL (ref 0.10–4.00)

## 2014-09-19 LAB — LIPID PANEL
CHOL/HDL RATIO: 4
Cholesterol: 142 mg/dL (ref 0–200)
HDL: 40.4 mg/dL (ref >39.00)
LDL CALC: 84 mg/dL (ref 0–99)
NonHDL: 101.6
TRIGLYCERIDES: 86 mg/dL (ref 0.0–149.0)
VLDL: 17.2 mg/dL (ref 0.0–40.0)

## 2014-09-19 LAB — POCT URINALYSIS DIPSTICK
Bilirubin, UA: NEGATIVE
Glucose, UA: NEGATIVE
KETONES UA: NEGATIVE
Leukocytes, UA: NEGATIVE
Nitrite, UA: NEGATIVE
PH UA: 5.5
Protein, UA: NEGATIVE
SPEC GRAV UA: 1.025
Urobilinogen, UA: 0.2

## 2014-09-19 LAB — BASIC METABOLIC PANEL
BUN: 22 mg/dL (ref 6–23)
CALCIUM: 9.4 mg/dL (ref 8.4–10.5)
CO2: 29 mEq/L (ref 19–32)
CREATININE: 1.06 mg/dL (ref 0.40–1.50)
Chloride: 103 mEq/L (ref 96–112)
GFR: 75.93 mL/min (ref >60.00)
GLUCOSE: 114 mg/dL — AB (ref 70–99)
Potassium: 4.4 mEq/L (ref 3.5–5.1)
SODIUM: 140 meq/L (ref 135–145)

## 2014-09-19 LAB — TSH: TSH: 1.74 u[IU]/mL (ref 0.35–4.50)

## 2014-10-01 ENCOUNTER — Ambulatory Visit (INDEPENDENT_AMBULATORY_CARE_PROVIDER_SITE_OTHER): Payer: BC Managed Care – PPO | Admitting: Family Medicine

## 2014-10-01 ENCOUNTER — Encounter: Payer: Self-pay | Admitting: Family Medicine

## 2014-10-01 VITALS — BP 103/70 | HR 75 | Temp 98.7°F | Ht 67.0 in | Wt 225.0 lb

## 2014-10-01 DIAGNOSIS — Z0000 Encounter for general adult medical examination without abnormal findings: Secondary | ICD-10-CM

## 2014-10-01 NOTE — Progress Notes (Signed)
Pre visit review using our clinic review tool, if applicable. No additional management support is needed unless otherwise documented below in the visit note. 

## 2014-10-01 NOTE — Progress Notes (Signed)
   Subjective:    Patient ID: Jack Pitts, male    DOB: [birth year], 59 y.o.   MRN: [id number]  HPI 59 yr old male for a cpx. He feels well but knows he is overweight. He does not exercise and he eats whatever he wants.    Review of Systems  Constitutional: Negative.   HENT: Negative.   Eyes: Negative.   Respiratory: Negative.   Cardiovascular: Negative.   Gastrointestinal: Negative.   Genitourinary: Negative.   Musculoskeletal: Negative.   Skin: Negative.   Neurological: Negative.   Psychiatric/Behavioral: Negative.        Objective:   Physical Exam  Constitutional: He is oriented to person, place, and time. He appears well-developed and well-nourished. No distress.  HENT:  Head: Normocephalic and atraumatic.  Right Ear: External ear normal.  Left Ear: External ear normal.  Nose: Nose normal.  Mouth/Throat: Oropharynx is clear and moist. No oropharyngeal exudate.  Eyes: Conjunctivae and EOM are normal. Pupils are equal, round, and reactive to light. Right eye exhibits no discharge. Left eye exhibits no discharge. No scleral icterus.  Neck: Neck supple. No JVD present. No tracheal deviation present. No thyromegaly present.  Cardiovascular: Normal rate, regular rhythm, normal heart sounds and intact distal pulses.  Exam reveals no gallop and no friction rub.   No murmur heard. EKG normal   Pulmonary/Chest: Effort normal and breath sounds normal. No respiratory distress. He has no wheezes. He has no rales. He exhibits no tenderness.  Abdominal: Soft. Bowel sounds are normal. He exhibits no distension. There is no tenderness. There is no rebound and no guarding.  Small reducible non-tender umbilical hernia  Genitourinary: Rectum normal, prostate normal and penis normal. Guaiac negative stool. No penile tenderness.  Musculoskeletal: Normal range of motion. He exhibits no edema or tenderness.  Lymphadenopathy:    He has no cervical adenopathy.  Neurological: He is alert and  oriented to person, place, and time. He has normal reflexes. No cranial nerve deficit. He exhibits normal muscle tone. Coordination normal.  Skin: Skin is warm and dry. No rash noted. He is not diaphoretic. No erythema. No pallor.  Psychiatric: He has a normal mood and affect. His behavior is normal. Judgment and thought content normal.          Assessment & Plan:  Well exam. He is overweight and his fasting glucose has been creeping up. We discussed diet and exercise, and he plans to lose weight.

## 2015-01-01 IMAGING — CR DG ANKLE COMPLETE 3+V*R*
3 series · 3 of 3 positions shown · non-contrast
Comparison: None.

CLINICAL DATA: Right lateral ankle and foot pain following injury 2
weeks ago.

EXAM:
RIGHT ANKLE - COMPLETE 3+ VIEW

[t ankle joint ap right]
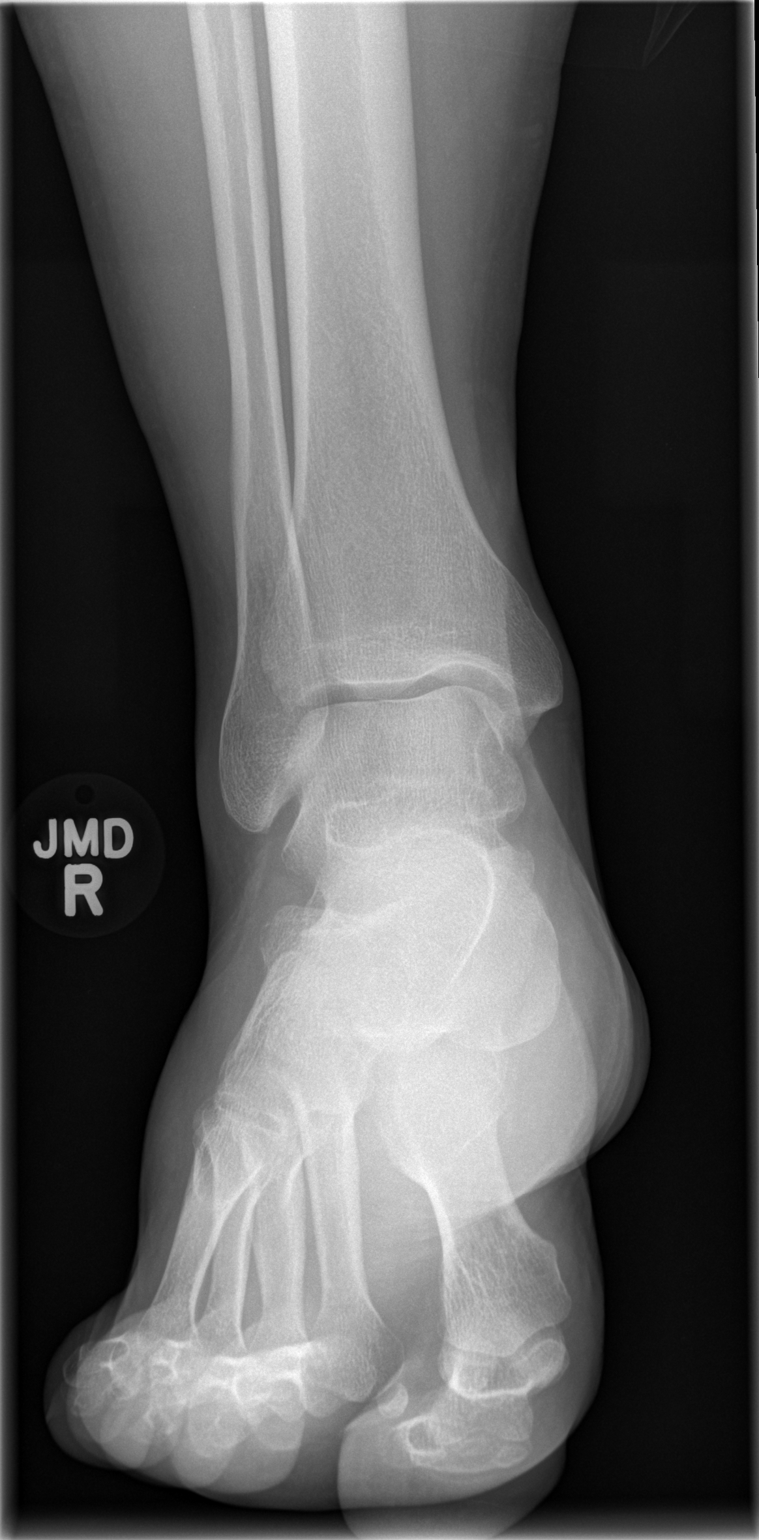

[t ankle joint oblique right]
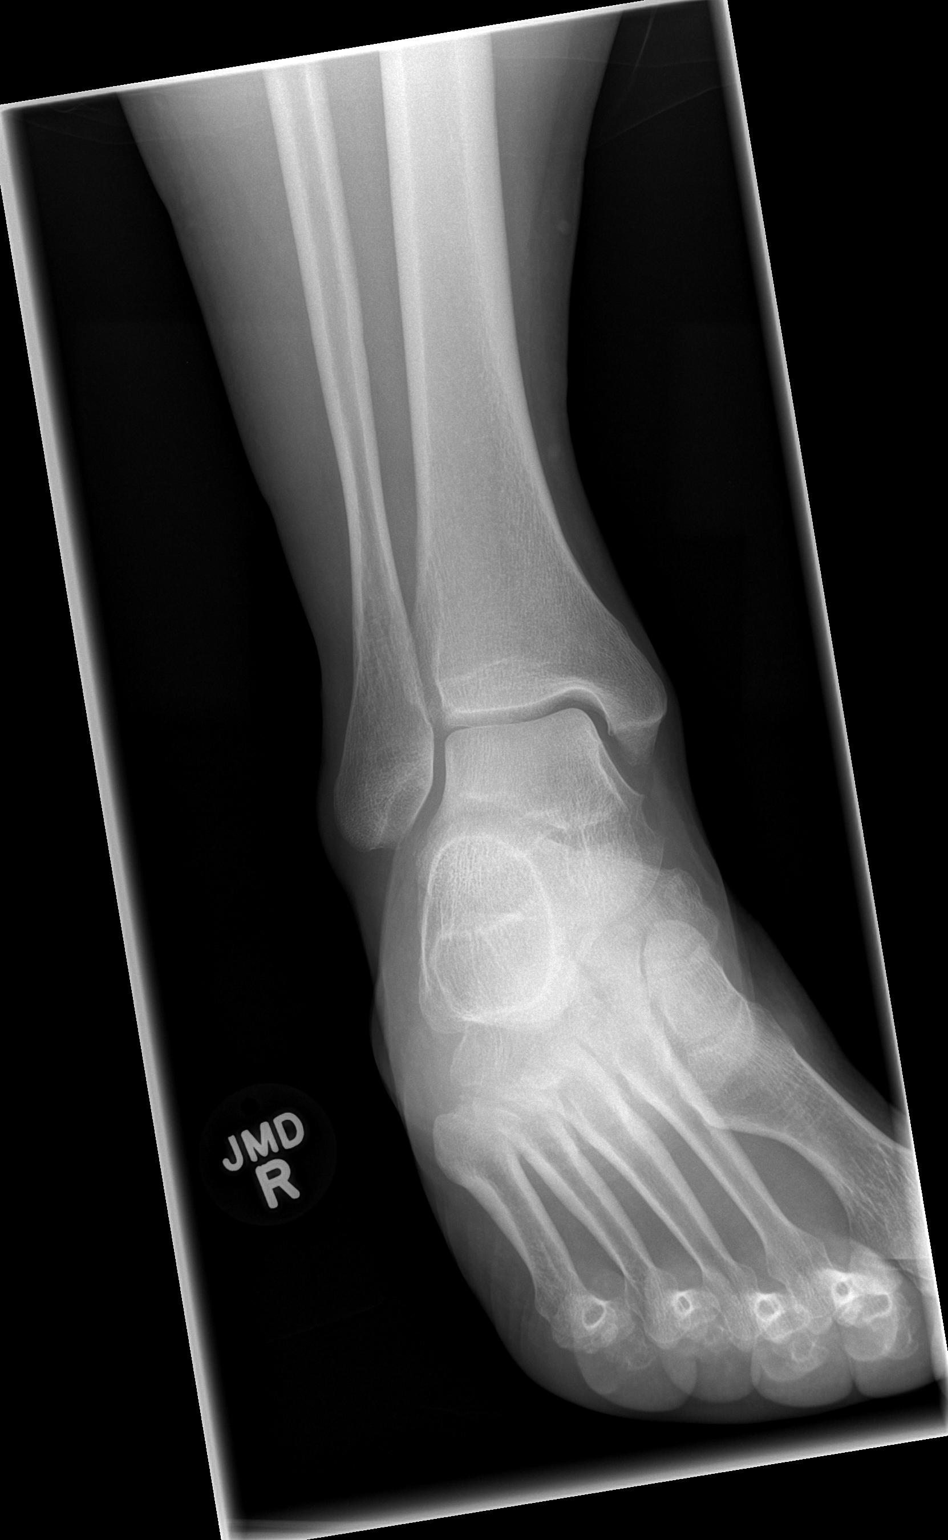

[t ankle joint lat right]
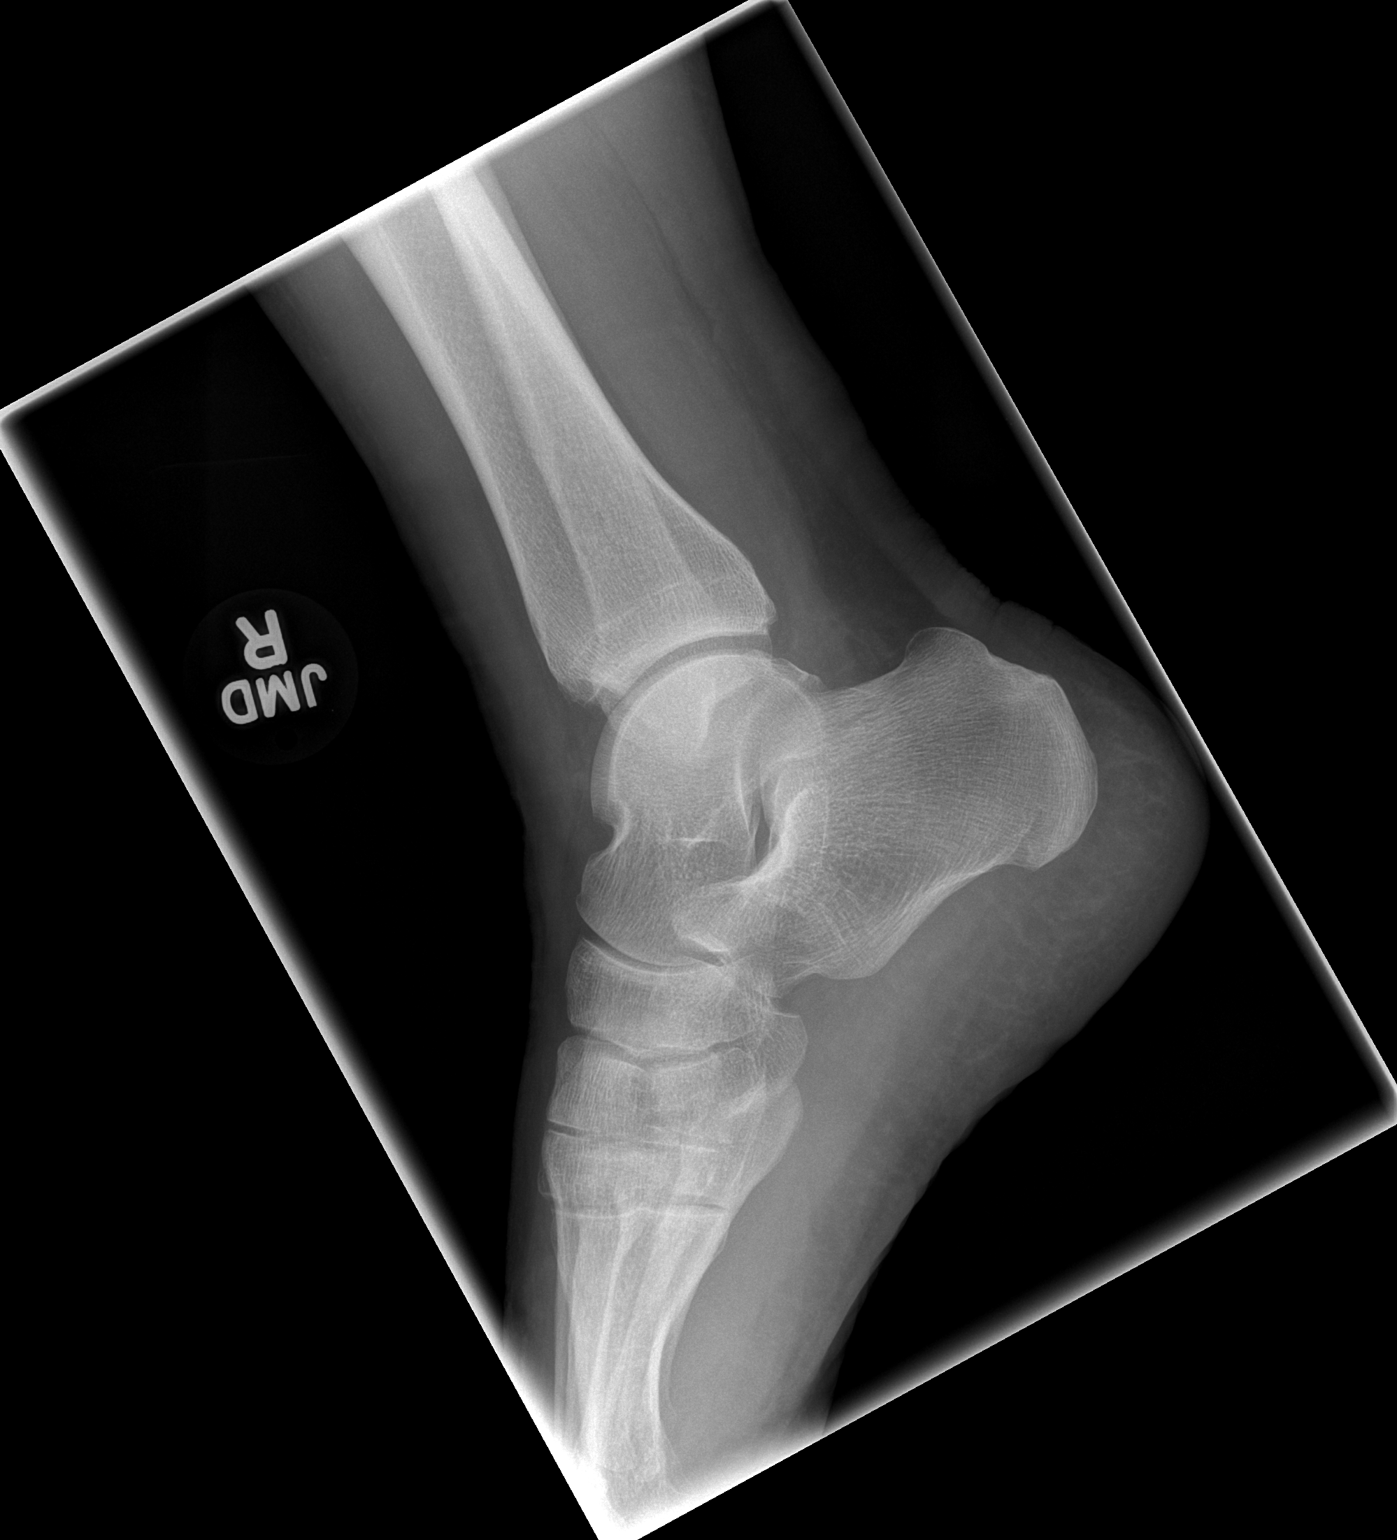

[3 of 3 positions shown; findings below may reference images not displayed]

FINDINGS: The mineralization and alignment are normal. There is no evidence of
acute fracture or dislocation. The joint spaces are maintained. No
focal soft tissue swelling is evident.
IMPRESSION: No acute osseous findings.

## 2015-12-06 ENCOUNTER — Telehealth: Payer: Self-pay | Admitting: Family Medicine

## 2015-12-06 DIAGNOSIS — G4733 Obstructive sleep apnea (adult) (pediatric): Secondary | ICD-10-CM

## 2015-12-06 NOTE — Telephone Encounter (Signed)
Referral was done  

## 2015-12-06 NOTE — Telephone Encounter (Signed)
Pt needs a referral to pulmonary, pt requesting supplies for CPAP/BIPAP.

## 2015-12-09 NOTE — Telephone Encounter (Signed)
I spoke with pt and went over below information. 

## 2015-12-13 ENCOUNTER — Telehealth: Payer: Self-pay | Admitting: Pulmonary Disease

## 2015-12-13 DIAGNOSIS — G4733 Obstructive sleep apnea (adult) (pediatric): Secondary | ICD-10-CM

## 2015-12-13 NOTE — Telephone Encounter (Signed)
lmtcb X1 for pt  

## 2015-12-16 NOTE — Telephone Encounter (Signed)
DME [facility]

## 2015-12-16 NOTE — Telephone Encounter (Signed)
Spoke with pt and he states that he does have Sleep Consult scheduled for November, but he needs a new CPAP mask as soon as possible as his has a broken valve and he cannot use. Per pt Dr Sharlene Motts was previously ordering supplies but has now referred pt here to follow for sleep.  VS - Please advise if ok to order CPAP mask with pending Consult appt. Thanks!

## 2015-12-17 NOTE — Telephone Encounter (Signed)
Okay to send order. 

## 2015-12-17 NOTE — Telephone Encounter (Signed)
Spoke with pt and advised. CPAP supply order placed. Nothing further needed.

## 2015-12-20 ENCOUNTER — Encounter (HOSPITAL_COMMUNITY): Payer: Self-pay | Admitting: Emergency Medicine

## 2015-12-20 ENCOUNTER — Ambulatory Visit (HOSPITAL_COMMUNITY)
Admission: EM | Admit: 2015-12-20 | Discharge: 2015-12-20 | Disposition: A | Discharge status: Home / Self Care | Attending: Family Medicine | Admitting: Family Medicine

## 2015-12-20 ENCOUNTER — Ambulatory Visit (INDEPENDENT_AMBULATORY_CARE_PROVIDER_SITE_OTHER)

## 2015-12-20 DIAGNOSIS — S82891A Other fracture of right lower leg, initial encounter for closed fracture: Secondary | ICD-10-CM

## 2015-12-20 MED ORDER — HYDROCODONE-ACETAMINOPHEN 5-325 MG PO TABS
2.0000 | ORAL_TABLET | Freq: Once | ORAL | Status: AC
  Administered 2015-12-20: 2 via ORAL

## 2015-12-20 MED ORDER — HYDROCODONE-ACETAMINOPHEN 5-325 MG PO TABS
ORAL_TABLET | ORAL | Status: AC
  Filled 2015-12-20: qty 2

## 2015-12-20 MED ORDER — OXYCODONE-ACETAMINOPHEN 5-325 MG PO TABS: 1.0000 | ORAL_TABLET | ORAL | 0 refills | Status: DC | PRN

## 2015-12-20 NOTE — ED Triage Notes (Signed)
Reports he fell at work while going down some steps.... Sates he did not realize there was still one more step and fell onto hard flooring  Sx today include: swelling and pain and unable to bear wt  Brought back on wheelchair  A&O x4... NAD

## 2015-12-20 NOTE — ED Provider Notes (Signed)
CSN: [id number]     Arrival date & time 12/20/15  1902 History   None    Chief Complaint  Patient presents with  . Knee Injury   (Consider location/radiation/quality/duration/timing/severity/associated sxs/prior Treatment) The history is provided by the patient.  Knee Pain  Location:  Knee Time since incident:  2 days Injury: yes   Mechanism of injury: fall   Fall:    Fall occurred:  Down stairs   Height of fall:  3-4 ft   Impact surface:  Concrete   Point of impact:  Knees   Entrapped after fall: no   Knee location:  R knee Pain details:    Quality:  Aching and throbbing   Severity:  Severe   Onset quality:  Gradual   Duration:  2 days   Timing:  Constant   Progression:  Worsening Chronicity:  New Dislocation: no   Foreign body present:  No foreign bodies Prior injury to area:  No Relieved by:  None tried Worsened by:  Bearing weight Ineffective treatments:  Ice and elevation Associated symptoms: decreased ROM and swelling   Associated symptoms: no fever and no numbness   Risk factors: no concern for non-accidental trauma, no frequent fractures, no known bone disorder, no obesity and no recent illness     Past Medical History:  Diagnosis Date  . Allergic rhinitis   . Heart murmur   . History of chickenpox    Past Surgical History:  Procedure Laterality Date  . COLONOSCOPY  10-11-13   per Dr. Marshell Levan polyps, repeat in 5 yrs  . GANGLION CYST EXCISION     right wrist 1969  . TONSILLECTOMY    . VASECTOMY     Family History  Problem Relation Age of Onset  . Alcohol abuse    . Arthritis    . Coronary artery disease    . Diabetes    . Hyperlipidemia    . Kidney disease    . Prostate cancer    . Sudden death    . Lymphoma Brother   . Colon cancer Neg Hx   . Pancreatic cancer Neg Hx   . Rectal cancer Neg Hx   . Stomach cancer Neg Hx    Social History  Substance Use Topics  . Smoking status: Never Smoker  . Smokeless tobacco: Never Used  .  Alcohol use 0.0 oz/week     Comment: occ    Review of Systems  Constitutional: Negative for fever.  All other systems reviewed and are negative.   Allergies  Review of patient's allergies indicates no known allergies.  Home Medications   Prior to Admission medications   Medication Sig Start Date End Date Taking? Authorizing Provider  oxyCODONE-acetaminophen (PERCOCET/ROXICET) 5-325 MG tablet Take 1 tablet by mouth every 4 (four) hours as needed for severe pain. 12/20/15   Jeryl Columbia, NP   Meds Ordered and Administered this Visit   Medications  HYDROcodone-acetaminophen (NORCO/VICODIN) 5-325 MG per tablet 2 tablet (2 tablets Oral Given 12/20/15 2144)    BP 135/68 (BP Location: Left Arm)   Pulse 87   Temp 98.6 F (37 C) (Oral)   Resp 12   SpO2 99%  No data found.   Physical Exam  Constitutional: He is oriented to person, place, and time. He appears well-developed and well-nourished.  HENT:  Head: Normocephalic and atraumatic.  Eyes: Conjunctivae are normal.  Cardiovascular: Normal rate.   Pulmonary/Chest: Effort normal.  Musculoskeletal:       Right knee:  He exhibits decreased range of motion, swelling, ecchymosis, erythema and bony tenderness. He exhibits no deformity. Tenderness found. Medial joint line tenderness noted.  Neurological: He is alert and oriented to person, place, and time.  Skin: Skin is warm and dry.  Psychiatric: He has a normal mood and affect.    Urgent Care Course   Clinical Course    Procedures (including critical care time)  Labs Review Labs Reviewed - No data to display  Imaging Review Dg Knee Complete 4 Views Right  Result Date: 12/20/2015 CLINICAL DATA:  [place] on steps with injury to the right knee. Hematoma, swelling, and pain medially. Difficulty weight-bearing. EXAM: RIGHT KNEE - COMPLETE 4+ VIEW COMPARISON:  None. FINDINGS: Displaced bone fragment over the medial femoral condyle likely represents an avulsion fracture. Mild  prepatellar soft tissue swelling. No significant effusion. No evidence of dislocation. No focal bone lesion or bone destruction. IMPRESSION: Displaced bone fragment over the medial femoral condyle likely representing avulsion fracture. Electronically Signed   By: Lucienne Capers M.D.   On: 12/20/2015 21:45     Visual Acuity Review  Right Eye Distance:   Left Eye Distance:   Bilateral Distance:    Right Eye Near:   Left Eye Near:    Bilateral Near:         MDM   1. Knee fracture, right, closed, initial encounter   (R) knee immobilizer Crutches Rx: Norco 1-2 q4h #15 Ortho follow up w/ Dr Ninfa Linden next week. Home care discussed and provided in print.    Jeryl Columbia, NP 12/20/15 2211

## 2016-01-17 ENCOUNTER — Telehealth: Payer: Self-pay | Admitting: Family Medicine

## 2016-01-17 NOTE — Telephone Encounter (Signed)
Error/ltd ° °

## 2016-01-20 ENCOUNTER — Other Ambulatory Visit (INDEPENDENT_AMBULATORY_CARE_PROVIDER_SITE_OTHER)

## 2016-01-20 DIAGNOSIS — Z0000 Encounter for general adult medical examination without abnormal findings: Secondary | ICD-10-CM

## 2016-01-20 LAB — POC URINALSYSI DIPSTICK (AUTOMATED)
Glucose, UA: NEGATIVE
Ketones, UA: NEGATIVE
Leukocytes, UA: NEGATIVE
Nitrite, UA: NEGATIVE
PH UA: 5.5
SPEC GRAV UA: 1.025
Urobilinogen, UA: 0.2

## 2016-01-20 LAB — CBC WITH DIFFERENTIAL/PLATELET
BASOS PCT: 0.1 % (ref 0.0–3.0)
Basophils Absolute: 0 10*3/uL (ref 0.0–0.1)
EOS PCT: 0.4 % (ref 0.0–5.0)
Eosinophils Absolute: 0.1 10*3/uL (ref 0.0–0.7)
HEMATOCRIT: 40.9 % (ref 39.0–52.0)
HEMOGLOBIN: 14.1 g/dL (ref 13.0–17.0)
Lymphocytes Relative: 11.2 % — ABNORMAL LOW (ref 12.0–46.0)
Lymphs Abs: 1.3 10*3/uL (ref 0.7–4.0)
MCHC: 34.5 g/dL (ref 30.0–36.0)
MCV: 90.7 fl (ref 78.0–100.0)
MONO ABS: 1.4 10*3/uL — AB (ref 0.1–1.0)
Monocytes Relative: 11.9 % (ref 3.0–12.0)
Neutro Abs: 8.7 10*3/uL — ABNORMAL HIGH (ref 1.4–7.7)
Neutrophils Relative %: 76.4 % (ref 43.0–77.0)
Platelets: 210 10*3/uL (ref 150.0–400.0)
RBC: 4.51 Mil/uL (ref 4.22–5.81)
RDW: 13.6 % (ref 11.5–15.5)
WBC: 11.4 10*3/uL — ABNORMAL HIGH (ref 4.0–10.5)

## 2016-01-20 LAB — LIPID PANEL
CHOLESTEROL: 131 mg/dL (ref 0–200)
HDL: 51.2 mg/dL (ref >39.00)
LDL Cholesterol: 66 mg/dL (ref 0–99)
NonHDL: 79.44
Total CHOL/HDL Ratio: 3
Triglycerides: 69 mg/dL (ref 0.0–149.0)
VLDL: 13.8 mg/dL (ref 0.0–40.0)

## 2016-01-20 LAB — HEPATIC FUNCTION PANEL
ALT: 15 U/L (ref 0–53)
AST: 10 U/L (ref 0–37)
Albumin: 4.1 g/dL (ref 3.5–5.2)
Alkaline Phosphatase: 73 U/L (ref 39–117)
BILIRUBIN TOTAL: 1.2 mg/dL (ref 0.2–1.2)
Bilirubin, Direct: 0.3 mg/dL (ref 0.0–0.3)
Total Protein: 6.6 g/dL (ref 6.0–8.3)

## 2016-01-20 LAB — BASIC METABOLIC PANEL
BUN: 19 mg/dL (ref 6–23)
CALCIUM: 9.5 mg/dL (ref 8.4–10.5)
CO2: 29 meq/L (ref 19–32)
Chloride: 101 mEq/L (ref 96–112)
Creatinine, Ser: 1.16 mg/dL (ref 0.40–1.50)
GFR: 68.12 mL/min (ref >60.00)
Glucose, Bld: 137 mg/dL — ABNORMAL HIGH (ref 70–99)
Potassium: 3.8 mEq/L (ref 3.5–5.1)
Sodium: 140 mEq/L (ref 135–145)

## 2016-01-20 LAB — PSA: PSA: 0.59 ng/mL (ref 0.10–4.00)

## 2016-01-20 LAB — TSH: TSH: 1.21 u[IU]/mL (ref 0.35–4.50)

## 2016-01-21 ENCOUNTER — Other Ambulatory Visit

## 2016-01-22 ENCOUNTER — Ambulatory Visit (INDEPENDENT_AMBULATORY_CARE_PROVIDER_SITE_OTHER): Admitting: Family Medicine

## 2016-01-22 ENCOUNTER — Encounter: Payer: Self-pay | Admitting: Family Medicine

## 2016-01-22 VITALS — BP 115/71 | HR 77 | Temp 98.2°F | Ht 67.0 in | Wt 217.0 lb

## 2016-01-22 DIAGNOSIS — E669 Obesity, unspecified: Secondary | ICD-10-CM | POA: Diagnosis not present

## 2016-01-22 DIAGNOSIS — E1169 Type 2 diabetes mellitus with other specified complication: Secondary | ICD-10-CM | POA: Diagnosis not present

## 2016-01-22 DIAGNOSIS — Z23 Encounter for immunization: Secondary | ICD-10-CM

## 2016-01-22 DIAGNOSIS — Z0000 Encounter for general adult medical examination without abnormal findings: Secondary | ICD-10-CM | POA: Diagnosis not present

## 2016-01-22 LAB — HEMOGLOBIN A1C: HEMOGLOBIN A1C: 6 % (ref 4.6–6.5)

## 2016-01-22 MED ORDER — METFORMIN HCL 500 MG PO TABS: 500.0000 mg | ORAL_TABLET | Freq: Two times a day (BID) | ORAL | 3 refills | Status: DC

## 2016-01-22 NOTE — Addendum Note (Signed)
Addended by: Aggie Hacker A on: 01/22/2016 03:34 PM   Modules accepted: Orders

## 2016-01-22 NOTE — Progress Notes (Signed)
   Subjective:    Patient ID: Jack Pitts, male    DOB: [birth year], 60 y.o.   MRN: [id number]  HPI 60 yr old male for a well exam. He has done well except for an injury to the right knee on 12-18-15. He feel down some steps in his home. He went to the ER and then was seen by Dr. French Ana. An MRI revealed no fractures and it sounds like he just had a serious contusion. His fasting glucose has been creeping up over the past few years and this time it is up to 137.    Review of Systems  Constitutional: Negative.   HENT: Negative.   Eyes: Negative.   Respiratory: Negative.   Cardiovascular: Negative.   Gastrointestinal: Negative.   Genitourinary: Negative.   Musculoskeletal: Negative.   Skin: Negative.   Neurological: Negative.   Psychiatric/Behavioral: Negative.        Objective:   Physical Exam  Constitutional: He is oriented to person, place, and time. He appears well-developed and well-nourished. No distress.  HENT:  Head: Normocephalic and atraumatic.  Right Ear: External ear normal.  Left Ear: External ear normal.  Nose: Nose normal.  Mouth/Throat: Oropharynx is clear and moist. No oropharyngeal exudate.  Eyes: Conjunctivae and EOM are normal. Pupils are equal, round, and reactive to light. Right eye exhibits no discharge. Left eye exhibits no discharge. No scleral icterus.  Neck: Neck supple. No JVD present. No tracheal deviation present. No thyromegaly present.  Cardiovascular: Normal rate, regular rhythm, normal heart sounds and intact distal pulses.  Exam reveals no gallop and no friction rub.   No murmur heard. EKG normal   Pulmonary/Chest: Effort normal and breath sounds normal. No respiratory distress. He has no wheezes. He has no rales. He exhibits no tenderness.  Abdominal: Soft. Bowel sounds are normal. He exhibits no distension and no mass. There is no tenderness. There is no rebound and no guarding.  Genitourinary: Rectum normal, prostate normal and penis  normal. Rectal exam shows guaiac negative stool. No penile tenderness.  Musculoskeletal: Normal range of motion. He exhibits no edema or tenderness.  Lymphadenopathy:    He has no cervical adenopathy.  Neurological: He is alert and oriented to person, place, and time. He has normal reflexes. No cranial nerve deficit. He exhibits normal muscle tone. Coordination normal.  Skin: Skin is warm and dry. No rash noted. He is not diaphoretic. No erythema. No pallor.  Psychiatric: He has a normal mood and affect. His behavior is normal. Judgment and thought content normal.          Assessment & Plan:  Well exam with a new diagnosis of type 2 diabetes. We discussed diet and exercise. I will refer him to meet with Nutrition. Start on Metformin 500 mg bid. We will send him for his first A1c today. Recheck one month. Laurey Morale, MD

## 2016-01-22 NOTE — Progress Notes (Signed)
Pre visit review using our clinic review tool, if applicable. No additional management support is needed unless otherwise documented below in the visit note. 

## 2016-01-23 MED ORDER — OXYCODONE-ACETAMINOPHEN 5-325 MG PO TABS: 1.0000 | ORAL_TABLET | ORAL | 0 refills | Status: DC | PRN

## 2016-01-23 NOTE — Addendum Note (Signed)
Addended by: Alysia Penna A on: 01/23/2016 09:10 AM   Modules accepted: Orders

## 2016-01-24 ENCOUNTER — Telehealth: Payer: Self-pay | Admitting: Family Medicine

## 2016-01-24 NOTE — Telephone Encounter (Signed)
ERROR

## 2016-01-31 ENCOUNTER — Institutional Professional Consult (permissible substitution): Admitting: Pulmonary Disease

## 2016-09-09 ENCOUNTER — Encounter: Payer: Self-pay | Admitting: Family Medicine

## 2016-09-09 ENCOUNTER — Ambulatory Visit (INDEPENDENT_AMBULATORY_CARE_PROVIDER_SITE_OTHER): Admitting: Family Medicine

## 2016-09-09 VITALS — BP 127/84 | HR 98 | Temp 98.3°F | Ht 67.0 in | Wt 217.0 lb

## 2016-09-09 DIAGNOSIS — H9313 Tinnitus, bilateral: Secondary | ICD-10-CM | POA: Diagnosis not present

## 2016-09-09 DIAGNOSIS — W57XXXA Bitten or stung by nonvenomous insect and other nonvenomous arthropods, initial encounter: Secondary | ICD-10-CM | POA: Diagnosis not present

## 2016-09-09 DIAGNOSIS — H9193 Unspecified hearing loss, bilateral: Secondary | ICD-10-CM | POA: Diagnosis not present

## 2016-09-09 NOTE — Patient Instructions (Signed)
WE NOW OFFER   [facility] FAST TRACK!!!  SAME DAY Appointments for ACUTE CARE  Such as: Sprains, Injuries, cuts, abrasions, rashes, muscle pain, joint pain, back pain Colds, flu, sore throats, headache, allergies, cough, fever  Ear pain, sinus and eye infections Abdominal pain, nausea, vomiting, diarrhea, upset stomach Animal/insect bites  3 Easy Ways to Schedule: Walk-In Scheduling Call in scheduling Mychart Sign-up: [email or web address]         

## 2016-09-09 NOTE — Progress Notes (Signed)
   Subjective:    Patient ID: Jack Pitts, male    DOB: 1956-03-12, 61 y.o.   MRN: [id number]  HPI Here for several issues. First he has 2 tick bites for [place] to check. They appeared about a week ago, no rashes and he feels fine. Also he has had decreased hearing along with high pitched "cricket sounds" in both ears. No dizziness or pain.    Review of Systems  Constitutional: Negative.   HENT: Positive for hearing loss and tinnitus. Negative for congestion, ear pain, postnasal drip, sinus pain and sinus pressure.   Eyes: Negative.   Respiratory: Negative.   Cardiovascular: Negative.   Musculoskeletal: Negative.   Skin: Negative for rash.       Objective:   Physical Exam  Constitutional: He appears well-developed and well-nourished.  HENT:  Right Ear: External ear normal.  Left Ear: External ear normal.  Nose: Nose normal.  Mouth/Throat: Oropharynx is clear and moist.  Eyes: Conjunctivae are normal.  Neck: Neck supple. No thyromegaly present.  Cardiovascular: Normal rate, regular rhythm, normal heart sounds and intact distal pulses.   Pulmonary/Chest: Effort normal and breath sounds normal.  Lymphadenopathy:    He has no cervical adenopathy.  Skin:  Both forearms have a small bite mark with a scab that look clean, no rashes seen           Assessment & Plan:  The tick bites are benign, no treatment is needed. We will refer to ENT for the hearing loss and tinnitus.  Alysia Penna, MD

## 2016-10-20 ENCOUNTER — Other Ambulatory Visit: Payer: Self-pay

## 2016-10-20 MED ORDER — METFORMIN HCL 500 MG PO TABS: 500.0000 mg | ORAL_TABLET | Freq: Two times a day (BID) | ORAL | 3 refills | Status: DC

## 2017-09-03 IMAGING — DX DG KNEE COMPLETE 4+V*R*
4 series · 4 of 4 positions shown · non-contrast
Comparison: None.

CLINICAL DATA: Fell on steps with injury to the right knee.
Hematoma, swelling, and pain medially. Difficulty weight-bearing.

EXAM:
RIGHT KNEE - COMPLETE 4+ VIEW

[knee ap]
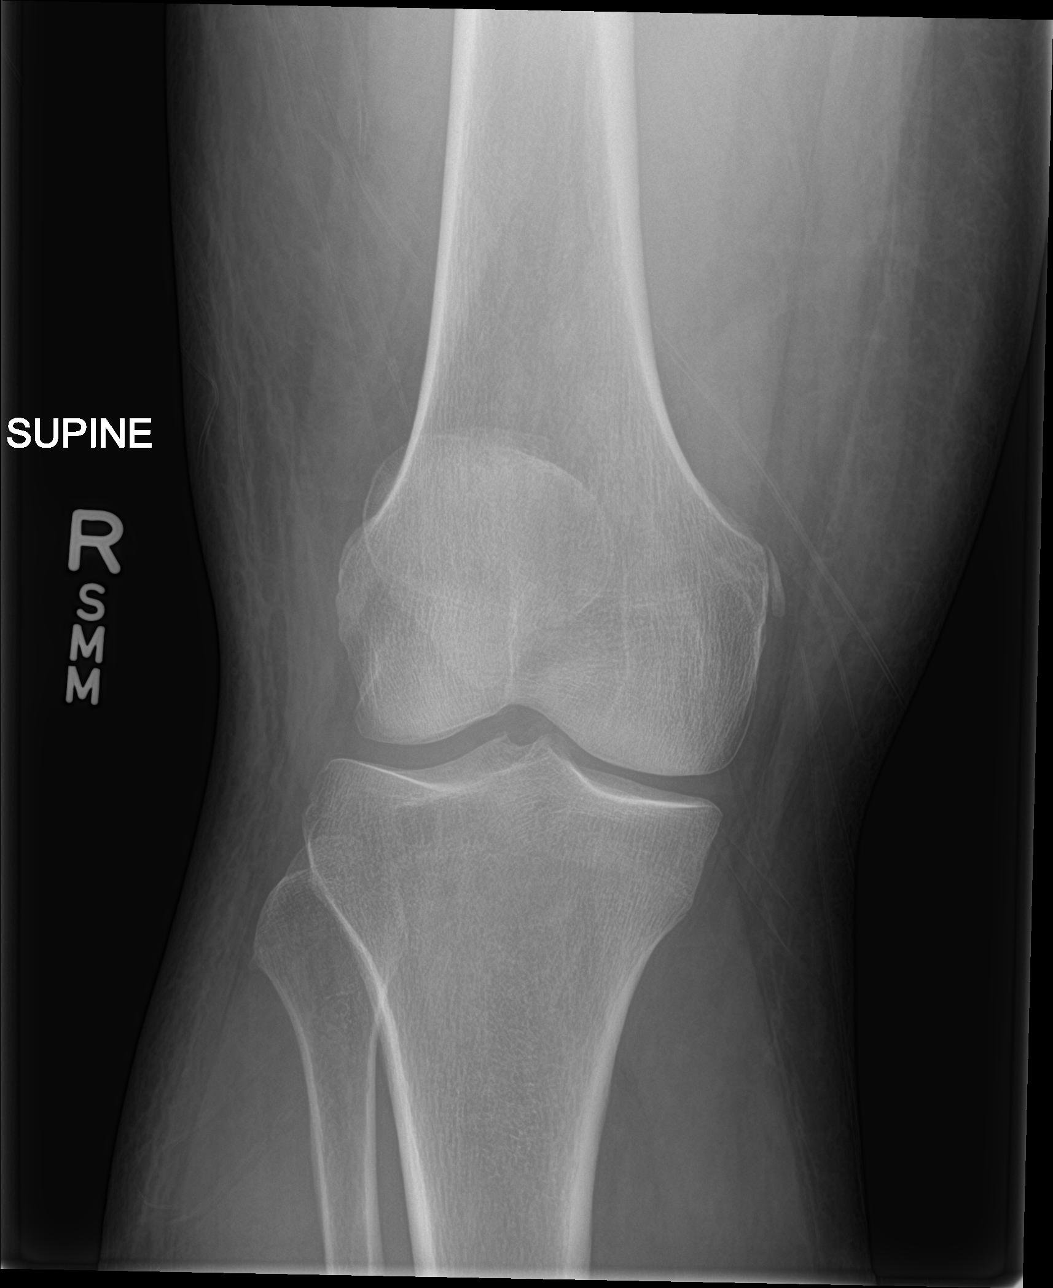

[knee obl (1 of 2)]
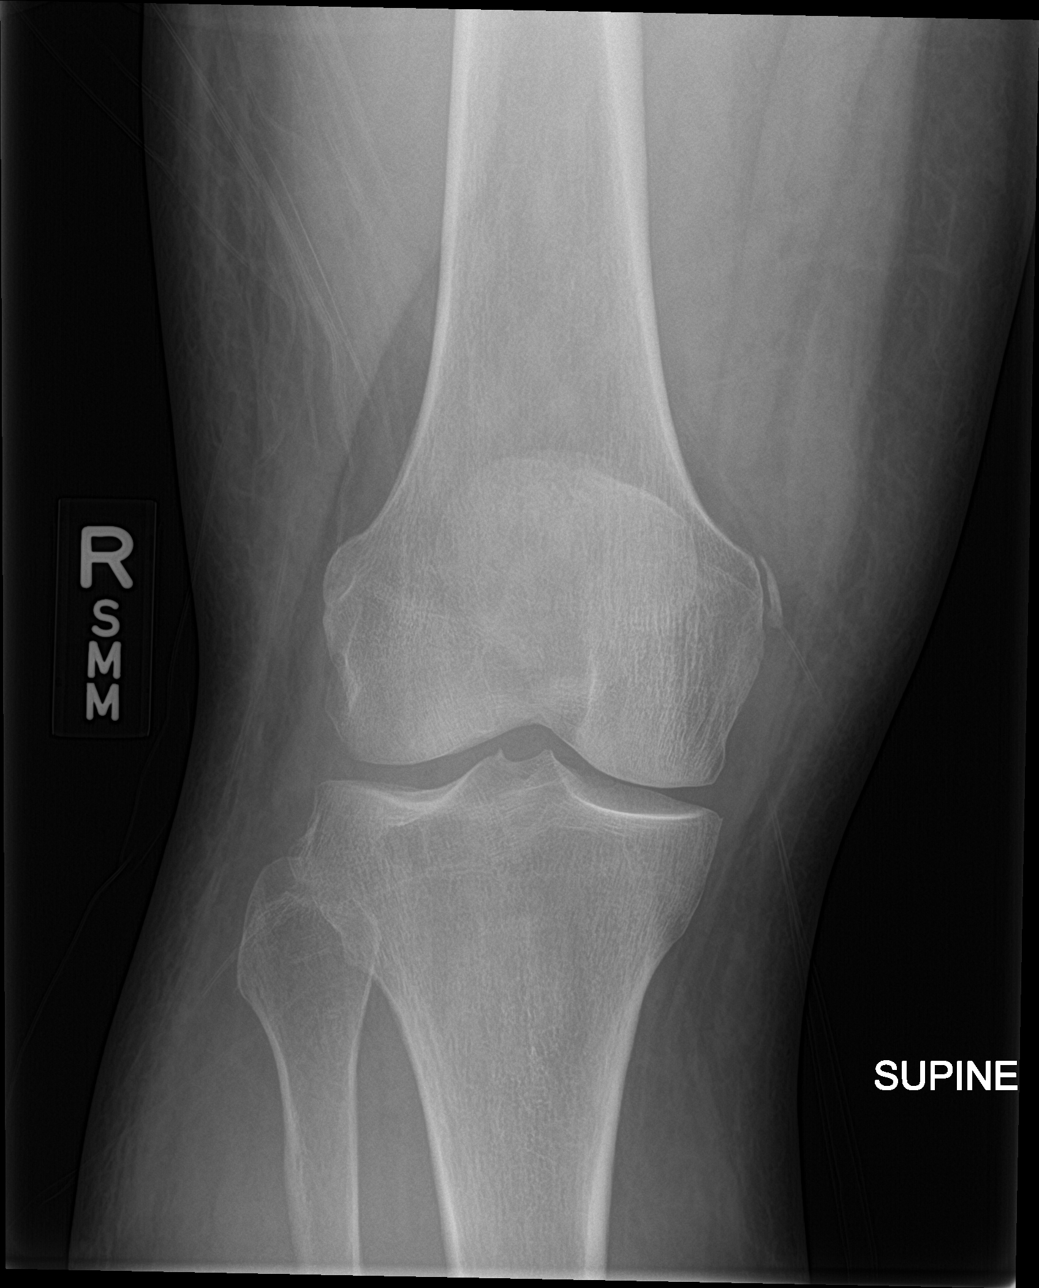

[knee obl (2 of 2)]
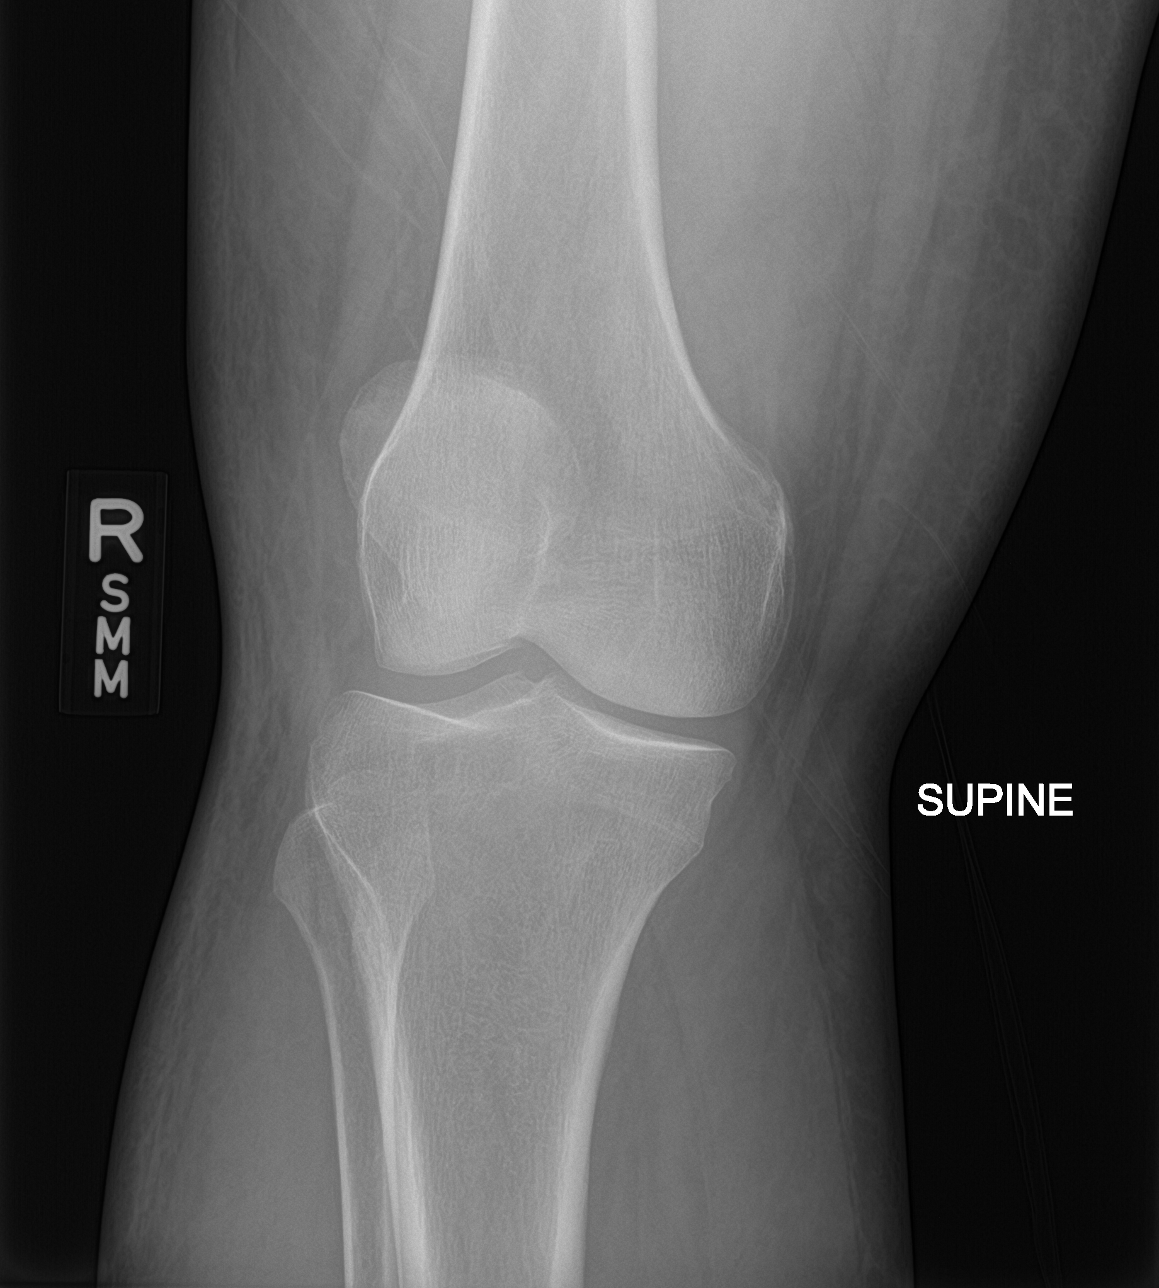

[knee lat]
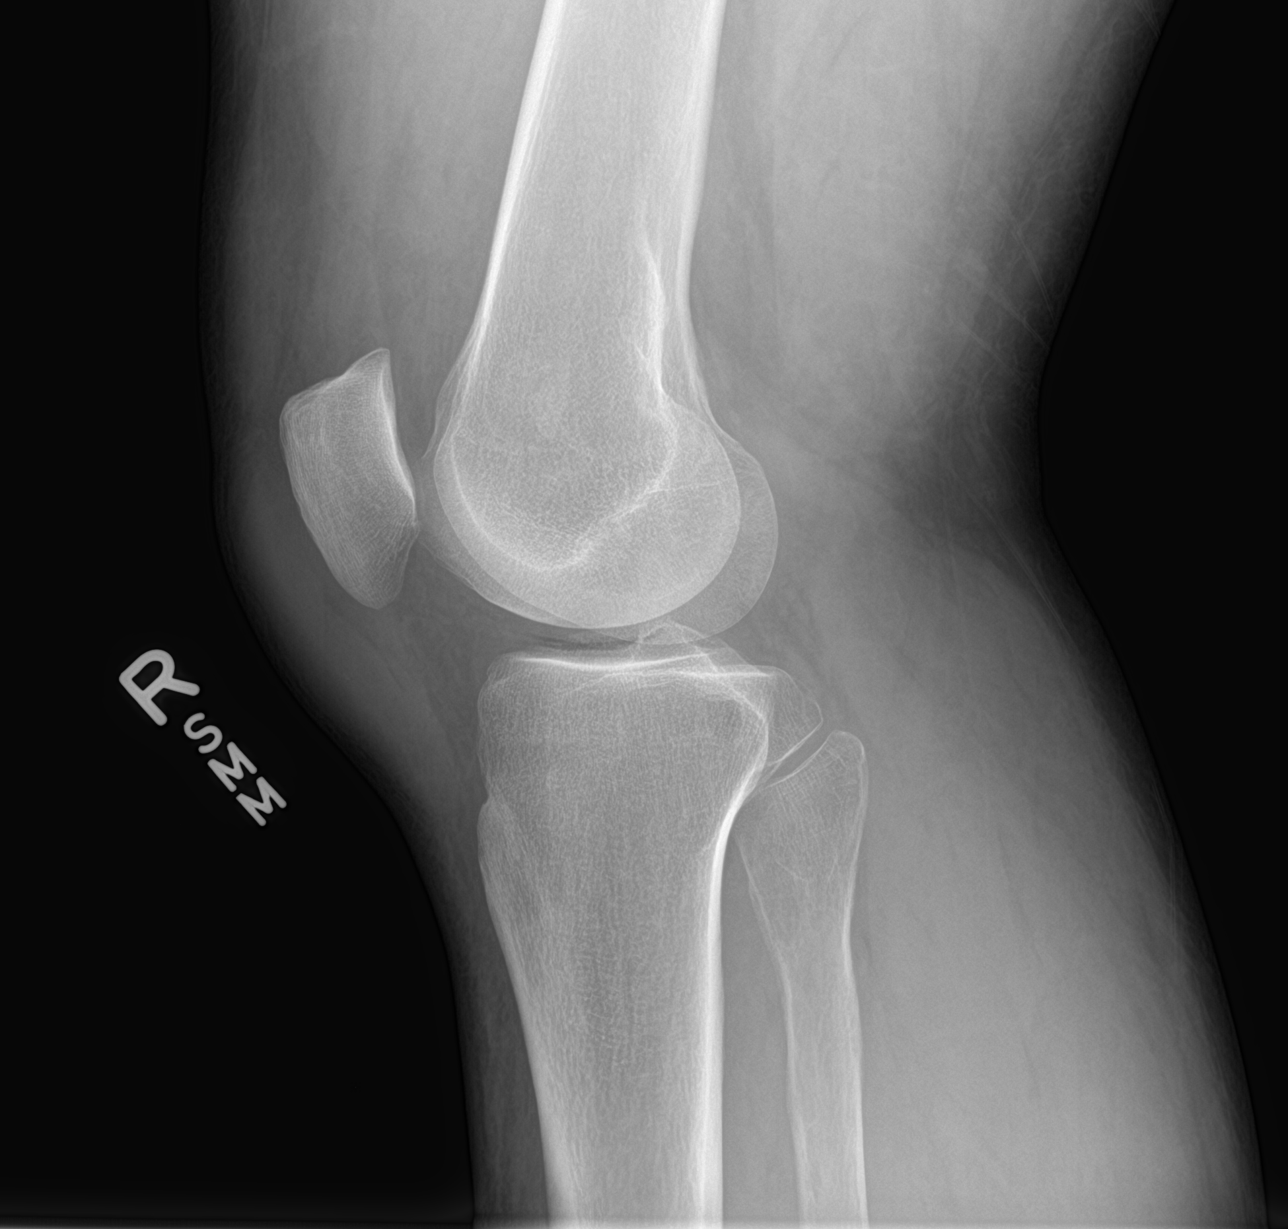

[4 of 4 positions shown; findings below may reference images not displayed]

FINDINGS: Displaced bone fragment over the medial femoral condyle likely
represents an avulsion fracture. Mild prepatellar soft tissue
swelling. No significant effusion. No evidence of dislocation. No
focal bone lesion or bone destruction.
IMPRESSION: Displaced bone fragment over the medial femoral condyle likely
representing avulsion fracture.

## 2018-05-27 ENCOUNTER — Encounter: Payer: Self-pay | Admitting: Family Medicine

## 2018-05-27 ENCOUNTER — Ambulatory Visit (INDEPENDENT_AMBULATORY_CARE_PROVIDER_SITE_OTHER): Admitting: Family Medicine

## 2018-05-27 VITALS — BP 128/82 | HR 73 | Temp 98.0°F | Ht 67.5 in | Wt 226.2 lb

## 2018-05-27 DIAGNOSIS — Z125 Encounter for screening for malignant neoplasm of prostate: Secondary | ICD-10-CM | POA: Diagnosis not present

## 2018-05-27 DIAGNOSIS — E669 Obesity, unspecified: Secondary | ICD-10-CM | POA: Diagnosis not present

## 2018-05-27 DIAGNOSIS — Z23 Encounter for immunization: Secondary | ICD-10-CM | POA: Diagnosis not present

## 2018-05-27 DIAGNOSIS — E1169 Type 2 diabetes mellitus with other specified complication: Secondary | ICD-10-CM

## 2018-05-27 DIAGNOSIS — Z0000 Encounter for general adult medical examination without abnormal findings: Secondary | ICD-10-CM | POA: Diagnosis not present

## 2018-05-27 LAB — CBC WITH DIFFERENTIAL/PLATELET
Basophils Absolute: 0.1 10*3/uL (ref 0.0–0.1)
Basophils Relative: 1 % (ref 0.0–3.0)
Eosinophils Absolute: 0.2 10*3/uL (ref 0.0–0.7)
Eosinophils Relative: 2.5 % (ref 0.0–5.0)
HCT: 42.3 % (ref 39.0–52.0)
Hemoglobin: 14.5 g/dL (ref 13.0–17.0)
LYMPHS ABS: 1.6 10*3/uL (ref 0.7–4.0)
Lymphocytes Relative: 23.5 % (ref 12.0–46.0)
MCHC: 34.2 g/dL (ref 30.0–36.0)
MCV: 90.5 fl (ref 78.0–100.0)
Monocytes Absolute: 0.6 10*3/uL (ref 0.1–1.0)
Monocytes Relative: 9.6 % (ref 3.0–12.0)
NEUTROS ABS: 4.2 10*3/uL (ref 1.4–7.7)
Neutrophils Relative %: 63.4 % (ref 43.0–77.0)
Platelets: 221 10*3/uL (ref 150.0–400.0)
RBC: 4.68 Mil/uL (ref 4.22–5.81)
RDW: 13.8 % (ref 11.5–15.5)
WBC: 6.7 10*3/uL (ref 4.0–10.5)

## 2018-05-27 LAB — HEPATIC FUNCTION PANEL
ALK PHOS: 81 U/L (ref 39–117)
ALT: 23 U/L (ref 0–53)
AST: 12 U/L (ref 0–37)
Albumin: 4.3 g/dL (ref 3.5–5.2)
Bilirubin, Direct: 0.1 mg/dL (ref 0.0–0.3)
Total Bilirubin: 0.3 mg/dL (ref 0.2–1.2)
Total Protein: 6.7 g/dL (ref 6.0–8.3)

## 2018-05-27 LAB — BASIC METABOLIC PANEL
BUN: 26 mg/dL — ABNORMAL HIGH (ref 6–23)
CALCIUM: 9.6 mg/dL (ref 8.4–10.5)
CO2: 30 mEq/L (ref 19–32)
Chloride: 102 mEq/L (ref 96–112)
Creatinine, Ser: 0.97 mg/dL (ref 0.40–1.50)
GFR: 78.18 mL/min (ref >60.00)
Glucose, Bld: 112 mg/dL — ABNORMAL HIGH (ref 70–99)
Potassium: 4.4 mEq/L (ref 3.5–5.1)
Sodium: 139 mEq/L (ref 135–145)

## 2018-05-27 LAB — LIPID PANEL
Cholesterol: 153 mg/dL (ref 0–200)
HDL: 42 mg/dL (ref >39.00)
LDL Cholesterol: 81 mg/dL (ref 0–99)
NonHDL: 111.05
TRIGLYCERIDES: 148 mg/dL (ref 0.0–149.0)
Total CHOL/HDL Ratio: 4
VLDL: 29.6 mg/dL (ref 0.0–40.0)

## 2018-05-27 LAB — POC URINALSYSI DIPSTICK (AUTOMATED)
BILIRUBIN UA: NEGATIVE
GLUCOSE UA: NEGATIVE
Ketones, UA: NEGATIVE
Leukocytes, UA: NEGATIVE
Nitrite, UA: NEGATIVE
Protein, UA: POSITIVE — AB
Spec Grav, UA: 1.02 (ref 1.010–1.025)
Urobilinogen, UA: 0.2 E.U./dL
pH, UA: 6.5 (ref 5.0–8.0)

## 2018-05-27 LAB — TSH: TSH: 1.36 u[IU]/mL (ref 0.35–4.50)

## 2018-05-27 LAB — HEMOGLOBIN A1C: Hgb A1c MFr Bld: 6.4 % (ref 4.6–6.5)

## 2018-05-27 LAB — PSA: PSA: 2.86 ng/mL (ref 0.10–4.00)

## 2018-05-27 MED ORDER — METFORMIN HCL 500 MG PO TABS: 500.0000 mg | ORAL_TABLET | Freq: Two times a day (BID) | ORAL | 3 refills | Status: DC

## 2018-05-27 MED ORDER — METFORMIN HCL 500 MG PO TABS: 500.0000 mg | ORAL_TABLET | Freq: Two times a day (BID) | ORAL | 0 refills | Status: DC

## 2018-05-27 NOTE — Progress Notes (Signed)
Subjective:    Patient ID: Jack Pitts, male    DOB: [birth year], 63 y.o.   MRN: [id number]  HPI Here for a well exam. He feels fine. Unfortunately he has not checked his glucose in over a year and he ran out of Metformin months ago. He recently had an eye exam and there was no evidence of diabetic retinopathy.    Review of Systems  Constitutional: Negative.   HENT: Negative.   Eyes: Negative.   Respiratory: Negative.   Cardiovascular: Negative.   Gastrointestinal: Negative.   Genitourinary: Negative.   Musculoskeletal: Negative.   Skin: Negative.   Neurological: Negative.   Psychiatric/Behavioral: Negative.        Objective:   Physical Exam Constitutional:      General: He is not in acute distress.    Appearance: He is well-developed. He is not diaphoretic.  HENT:     Head: Normocephalic and atraumatic.     Right Ear: External ear normal.     Left Ear: External ear normal.     Nose: Nose normal.     Mouth/Throat:     Pharynx: No oropharyngeal exudate.  Eyes:     General: No scleral icterus.       Right eye: No discharge.        Left eye: No discharge.     Conjunctiva/sclera: Conjunctivae normal.     Pupils: Pupils are equal, round, and reactive to light.  Neck:     Musculoskeletal: Neck supple.     Thyroid: No thyromegaly.     Vascular: No JVD.     Trachea: No tracheal deviation.  Cardiovascular:     Rate and Rhythm: Normal rate and regular rhythm.     Heart sounds: Normal heart sounds. No murmur. No friction rub. No gallop.   Pulmonary:     Effort: Pulmonary effort is normal. No respiratory distress.     Breath sounds: Normal breath sounds. No wheezing or rales.  Chest:     Chest wall: No tenderness.  Abdominal:     General: Bowel sounds are normal. There is no distension.     Palpations: Abdomen is soft. There is no mass.     Tenderness: There is no abdominal tenderness. There is no guarding or rebound.  Genitourinary:    Penis: Normal. No  tenderness.      Prostate: Normal.     Rectum: Normal. Guaiac result negative.  Musculoskeletal: Normal range of motion.        General: No tenderness.  Lymphadenopathy:     Cervical: No cervical adenopathy.  Skin:    General: Skin is warm and dry.     Coloration: Skin is not pale.     Findings: No erythema or rash.  Neurological:     Mental Status: He is alert and oriented to person, place, and time.     Cranial Nerves: No cranial nerve deficit.     Motor: No abnormal muscle tone.     Coordination: Coordination normal.     Deep Tendon Reflexes: Reflexes are normal and symmetric. Reflexes normal.  Psychiatric:        Behavior: Behavior normal.        Thought Content: Thought content normal.        Judgment: Judgment normal.           Assessment & Plan:  Well exam. We discussed diet and exercise. Get fasting labs soon. I wrote for him to get his own glucometer. He will  start back on Metformin.  Alysia Penna, MD

## 2018-07-05 ENCOUNTER — Ambulatory Visit: Payer: Self-pay | Admitting: *Deleted

## 2018-07-05 NOTE — Telephone Encounter (Signed)
This encounter was created in error - please disregard.

## 2018-07-05 NOTE — Telephone Encounter (Signed)
Patient called back and says he called the health department as instructed and they are only testing on Monday's and Thursday's, he says he needs to get tested sooner than Thursday, so they can know about his wife who's in the hospital. I called the office and spoke to [facility], [facility] who says to let the patient know the office is not testing and he can go on the [facility] website to do a screening to see if he can be tested. I advised the patient of the above by Leigh, [facility] and he says he will try. He asked does he need a referral from Dr. Sarajane Jews, I advised no referral is needed that he will answer questions and if he meets the criteria for testing, he will get tested and if not, he will have to wait until Thursday at the Health Dept., patient verbalized understanding.

## 2018-07-05 NOTE — Telephone Encounter (Signed)
Pt questioning how to receive testing for Covid-19. States his wife is hospitalized with symptoms. States tested negative however wife's MD thinks it was a "False negative." States wife's MD suggested he and his son get tested. Both pt and son are asymptomatic.  Pt calling for advise regarding process. Pt directed to call Health Dept for information, guidance. Instructed to CB if any additional questions arise. Pt verbalizes understanding.  Reason for Disposition . COVID-19 Testing, questions about  Protocols used: CORONAVIRUS (COVID-19) DIAGNOSED OR SUSPECTED-A-AH

## 2018-07-08 ENCOUNTER — Encounter: Payer: Self-pay | Admitting: Family Medicine

## 2018-07-08 ENCOUNTER — Ambulatory Visit (INDEPENDENT_AMBULATORY_CARE_PROVIDER_SITE_OTHER): Admitting: Family Medicine

## 2018-07-08 ENCOUNTER — Other Ambulatory Visit: Payer: Self-pay

## 2018-07-08 DIAGNOSIS — Z209 Contact with and (suspected) exposure to unspecified communicable disease: Secondary | ICD-10-CM

## 2018-07-08 NOTE — Progress Notes (Signed)
   Subjective:    Patient ID: Jack Pitts, male    DOB: [birth year], 63 y.o.   MRN: [id number]  HPI Virtual Visit via Telephone Note  I connected with the patient on 07/08/18 at  8:00 AM EDT by telephone and verified that I am speaking with the correct person using two identifiers. We attempted to connect with [email or web address] but had technical difficulty with the audio and video.    I discussed the limitations, risks, security and privacy concerns of performing an evaluation and management service by telephone and the availability of in person appointments. I also discussed with the patient that there may be a patient responsible charge related to this service. The patient expressed understanding and agreed to proceed.  Location patient: home Location provider: work or home office Participants present for the call: patient, provider Patient did not have a visit in the prior 7 days to address this/these issue(s).   History of Present Illness: He wants to check in after his wife has been ill this week. She was admitted to the hospital on 07-04-18 with coughing, SOB,and fever. It was initially felt that she very likely had the Covid19 virus and she was treated for this. Her rapid test was negative but there was concern about a possible false negative. Therefore Jack Pitts was concerned that he may have the virus (even though he has had no symptoms whatsoever), so he was tetsed at the [facility] facility in [place] on 07-06-18. This was negative also. His wife has been treated for pneumonia and CHF, and hopefully she can come home today. Jack Pitts still feels fine.    Observations/Objective: Patient sounds cheerful and well on the phone. I do not appreciate any SOB. Speech and thought processing are grossly intact. Patient reported vitals:  Assessment and Plan: He is checking in because of his wife's illness, but he is healthy. Recheck prn.  Alysia Penna, MD   Follow Up Instructions:     [phone] 5-10  [phone] 11-20 [phone] 21-30 I did not refer this patient for an OV in the next 24 hours for this/these issue(s).  I discussed the assessment and treatment plan with the patient. The patient was provided an opportunity to ask questions and all were answered. The patient agreed with the plan and demonstrated an understanding of the instructions.   The patient was advised to call back or seek an in-person evaluation if the symptoms worsen or if the condition fails to improve as anticipated.  I provided 13 minutes of non-face-to-face time during this encounter.   Alysia Penna, MD    Review of Systems     Objective:   Physical Exam        Assessment & Plan:

## 2018-10-13 ENCOUNTER — Encounter: Payer: Self-pay | Admitting: Gastroenterology

## 2018-11-14 ENCOUNTER — Telehealth: Payer: Self-pay | Admitting: Family Medicine

## 2018-11-14 DIAGNOSIS — Z209 Contact with and (suspected) exposure to unspecified communicable disease: Secondary | ICD-10-CM

## 2018-11-14 DIAGNOSIS — E669 Obesity, unspecified: Secondary | ICD-10-CM

## 2018-11-14 DIAGNOSIS — E1169 Type 2 diabetes mellitus with other specified complication: Secondary | ICD-10-CM

## 2018-11-14 NOTE — Telephone Encounter (Signed)
Ok for labs? 

## 2018-11-14 NOTE — Telephone Encounter (Signed)
Copied from [facility] [id number]. Topic: General - Other >> Nov 14, 2018  8:47 AM Celene Kras A wrote: Reason for CRM: Pt called and is requesting lab orders be put in for blood sugar and a test for antibodies. Please advise.

## 2018-11-15 NOTE — Telephone Encounter (Signed)
The orders are in.

## 2018-11-15 NOTE — Telephone Encounter (Signed)
Lab appointment scheduled. Nothing further needed. 

## 2018-11-18 ENCOUNTER — Other Ambulatory Visit (INDEPENDENT_AMBULATORY_CARE_PROVIDER_SITE_OTHER)

## 2018-11-18 ENCOUNTER — Other Ambulatory Visit: Payer: Self-pay

## 2018-11-18 DIAGNOSIS — E669 Obesity, unspecified: Secondary | ICD-10-CM

## 2018-11-18 DIAGNOSIS — Z209 Contact with and (suspected) exposure to unspecified communicable disease: Secondary | ICD-10-CM

## 2018-11-18 DIAGNOSIS — E1169 Type 2 diabetes mellitus with other specified complication: Secondary | ICD-10-CM

## 2018-11-18 LAB — HEMOGLOBIN A1C: Hgb A1c MFr Bld: 6.6 % — ABNORMAL HIGH (ref 4.6–6.5)

## 2018-11-18 LAB — SARS-COV-2 IGG: SARS-COV-2 IgG: 0.03

## 2019-06-03 ENCOUNTER — Ambulatory Visit: Attending: Internal Medicine

## 2019-06-03 DIAGNOSIS — Z23 Encounter for immunization: Secondary | ICD-10-CM

## 2019-06-03 NOTE — Progress Notes (Signed)
   Covid-19 Vaccination Clinic  Name:  [name]    MRN: [id number] DOB: 1955-12-17  06/03/2019  [name] was observed post Covid-19 immunization for 15 minutes without incident. He was provided with Vaccine Information Sheet and instruction to access the V-Safe system.   [name] was instructed to call 911 with any severe reactions post vaccine: Marland Kitchen Difficulty breathing  . Swelling of face and throat  . A fast heartbeat  . A bad rash all over body  . Dizziness and weakness   Immunizations Administered    Name Date Dose VIS Date Route   Pfizer COVID-19 Vaccine 06/03/2019  2:27 PM 0.3 mL 03/03/2019 Intramuscular   Manufacturer: [place]   Lot: [id number]   [place]: [id number]

## 2019-06-27 ENCOUNTER — Ambulatory Visit

## 2019-06-27 ENCOUNTER — Ambulatory Visit: Attending: Internal Medicine

## 2019-06-27 DIAGNOSIS — Z23 Encounter for immunization: Secondary | ICD-10-CM

## 2019-06-27 NOTE — Progress Notes (Signed)
   Covid-19 Vaccination Clinic  Name:  Jack Pitts    MRN: [id number] DOB: September 22, 1955  06/27/2019  Mr. Jack Pitts was observed post Covid-19 immunization for 15 minutes without incident. He was provided with Vaccine Information Sheet and instruction to access the V-Safe system.   Mr. Jack Pitts was instructed to call 911 with any severe reactions post vaccine: Marland Kitchen Difficulty breathing  . Swelling of face and throat  . A fast heartbeat  . A bad rash all over body  . Dizziness and weakness   Immunizations Administered    Name Date Dose VIS Date Route   Pfizer COVID-19 Vaccine 06/27/2019  4:13 PM 0.3 mL 03/03/2019 Intramuscular   Manufacturer: [employer]   Lot: [id number]   [place]: [id number]

## 2019-07-28 ENCOUNTER — Telehealth: Payer: Self-pay | Admitting: Family Medicine

## 2019-07-28 NOTE — Telephone Encounter (Signed)
Medication Refill: Metformin   Pharmacy: [facility]  FAX: [phone]

## 2019-08-01 ENCOUNTER — Encounter: Payer: Self-pay | Admitting: Gastroenterology

## 2019-08-01 NOTE — Telephone Encounter (Addendum)
Patient needs an appointment for further refills.  Left message for patient to call back.

## 2019-08-07 ENCOUNTER — Other Ambulatory Visit: Payer: Self-pay

## 2019-08-08 ENCOUNTER — Encounter: Payer: Self-pay | Admitting: Family Medicine

## 2019-08-08 ENCOUNTER — Ambulatory Visit (INDEPENDENT_AMBULATORY_CARE_PROVIDER_SITE_OTHER): Admitting: Family Medicine

## 2019-08-08 VITALS — BP 120/64 | HR 78 | Temp 97.5°F | Wt 233.2 lb

## 2019-08-08 DIAGNOSIS — K429 Umbilical hernia without obstruction or gangrene: Secondary | ICD-10-CM

## 2019-08-08 DIAGNOSIS — E1169 Type 2 diabetes mellitus with other specified complication: Secondary | ICD-10-CM | POA: Diagnosis not present

## 2019-08-08 DIAGNOSIS — E669 Obesity, unspecified: Secondary | ICD-10-CM

## 2019-08-08 DIAGNOSIS — M25521 Pain in right elbow: Secondary | ICD-10-CM | POA: Diagnosis not present

## 2019-08-08 LAB — HEMOGLOBIN A1C: Hgb A1c MFr Bld: 6.8 % — ABNORMAL HIGH (ref 4.6–6.5)

## 2019-08-08 MED ORDER — METFORMIN HCL 500 MG PO TABS: 500.0000 mg | ORAL_TABLET | Freq: Two times a day (BID) | ORAL | 3 refills | Status: DC

## 2019-08-08 NOTE — Progress Notes (Signed)
   Subjective:    Patient ID: Jack Pitts, male    DOB: [birth year], 64 y.o.   MRN: [id number]  HPI Here to discuss several issues. First he needs refills on Metformin. He does not check his glucoses at home. His last A1c a year ago was 6.6. He admits to gained 10 lbs over the past year. Second he is concerned about the hernia at his belly button. It is not painful but it is getting larger. Third he mentions some intermittent mild pain in the right elbow that started about 4 months ago. No hx of trauma. He is scheduled for a colonoscopy on 09-14-19.    Review of Systems  Constitutional: Negative.   Respiratory: Negative.   Cardiovascular: Negative.   Musculoskeletal: Positive for arthralgias.       Objective:   Physical Exam Constitutional:      Appearance: He is obese.  Cardiovascular:     Rate and Rhythm: Normal rate and regular rhythm.     Pulses: Normal pulses.     Heart sounds: Normal heart sounds.  Pulmonary:     Effort: Pulmonary effort is normal.     Breath sounds: Normal breath sounds.  Abdominal:     General: Abdomen is flat. Bowel sounds are normal. There is no distension.     Palpations: Abdomen is soft.     Tenderness: There is no abdominal tenderness. There is no guarding or rebound.     Comments: Large non-tender non-reducible umbilical hernia   Musculoskeletal:     Comments: He is tender over both the medial and the lateral epicondyles of the right elbow. Full ROM   Neurological:     Mental Status: He is alert.           Assessment & Plan:  For the diabetes, we will get an A1c today. Refer to Nutrition to assist with weight loss. I wrote for him to get his own glucometer so he can follow hios glucoses at home. Refer to Surgery for the hernia. He has tennis elbow and golfer's elbow, an dhe can treat this with ice packs and Ibuprofen.  Alysia Penna, MD

## 2019-08-30 ENCOUNTER — Ambulatory Visit: Admitting: Dietician

## 2019-08-31 ENCOUNTER — Other Ambulatory Visit: Payer: Self-pay

## 2019-08-31 ENCOUNTER — Ambulatory Visit (AMBULATORY_SURGERY_CENTER): Payer: Self-pay

## 2019-08-31 VITALS — Ht 67.5 in | Wt 235.0 lb

## 2019-08-31 DIAGNOSIS — Z86010 Personal history of colonic polyps: Secondary | ICD-10-CM

## 2019-08-31 NOTE — Progress Notes (Signed)
No egg or soy allergy known to patient  No issues with past sedation with any surgeries  or procedures, no intubation problems  No diet pills per patient No home 02 use per patient  No blood thinners per patient  Pt denies issues with constipation  No A fib or A flutter  EMMI video sent to pt's e mail   Pt has completed vaccines for covid  Due to the COVID-19 pandemic we are asking patients to follow these guidelines. Please only bring one care partner. Please be aware that your care partner may wait in the car in the parking lot or if they feel like they will be too hot to wait in the car, they may wait in the lobby on the 4th floor. All care partners are required to wear a mask the entire time (we do not have any that we can provide them), they need to practice social distancing, and we will do a Covid check for all patient's and care partners when you arrive. Also we will check their temperature and your temperature. If the care partner waits in their car they need to stay in the parking lot the entire time and we will call them on their cell phone when the patient is ready for discharge so they can bring the car to the front of the building. Also all patient's will need to wear a mask into building.

## 2019-09-14 ENCOUNTER — Encounter: Payer: Self-pay | Admitting: Gastroenterology

## 2019-09-14 ENCOUNTER — Ambulatory Visit (AMBULATORY_SURGERY_CENTER): Admitting: Gastroenterology

## 2019-09-14 ENCOUNTER — Other Ambulatory Visit: Payer: Self-pay

## 2019-09-14 VITALS — BP 125/76 | HR 74 | Temp 97.7°F | Resp 17 | Ht 67.0 in | Wt 235.0 lb

## 2019-09-14 DIAGNOSIS — Z86010 Personal history of colonic polyps: Secondary | ICD-10-CM | POA: Diagnosis present

## 2019-09-14 DIAGNOSIS — D122 Benign neoplasm of ascending colon: Secondary | ICD-10-CM

## 2019-09-14 DIAGNOSIS — D123 Benign neoplasm of transverse colon: Secondary | ICD-10-CM | POA: Diagnosis not present

## 2019-09-14 HISTORY — PX: COLONOSCOPY: SHX174

## 2019-09-14 MED ORDER — SODIUM CHLORIDE 0.9 % IV SOLN: 500.0000 mL | INTRAVENOUS | Status: DC

## 2019-09-14 NOTE — Progress Notes (Signed)
Called to room to assist during endoscopic procedure.  Patient ID and intended procedure confirmed with present staff. Received instructions for my participation in the procedure from the performing physician.  

## 2019-09-14 NOTE — Patient Instructions (Signed)
Handouts Provided:  Polyps and High Fiber Diet  Use FiberCon 1-2 tablets by mouth daily  YOU HAD AN ENDOSCOPIC PROCEDURE TODAY AT [facility]:   Refer to the procedure report that was given to you for any specific questions about what was found during the examination.  If the procedure report does not answer your questions, please call your gastroenterologist to clarify.  If you requested that your care partner not be given the details of your procedure findings, then the procedure report has been included in a sealed envelope for you to review at your convenience later.  YOU SHOULD EXPECT: Some feelings of bloating in the abdomen. Passage of more gas than usual.  Walking can help get rid of the air that was put into your GI tract during the procedure and reduce the bloating. If you had a lower endoscopy (such as a colonoscopy or flexible sigmoidoscopy) you may notice spotting of blood in your stool or on the toilet paper. If you underwent a bowel prep for your procedure, you may not have a normal bowel movement for a few days.  Please Note:  You might notice some irritation and congestion in your nose or some drainage.  This is from the oxygen used during your procedure.  There is no need for concern and it should clear up in a day or so.  SYMPTOMS TO REPORT IMMEDIATELY:   Following lower endoscopy (colonoscopy or flexible sigmoidoscopy):  Excessive amounts of blood in the stool  Significant tenderness or worsening of abdominal pains  Swelling of the abdomen that is new, acute  Fever of 100F or higher  For urgent or emergent issues, a gastroenterologist can be reached at any hour by calling [phone]. Do not use MyChart messaging for urgent concerns.    DIET:  We do recommend a small meal at first, but then you may proceed to your regular diet.  Drink plenty of fluids but you should avoid alcoholic beverages for 24 hours.  ACTIVITY:  You should plan to take it easy  for the rest of today and you should NOT DRIVE or use heavy machinery until tomorrow (because of the sedation medicines used during the test).    FOLLOW UP: Our staff will call the number listed on your records 48-72 hours following your procedure to check on you and address any questions or concerns that you may have regarding the information given to you following your procedure. If we do not reach you, we will leave a message.  We will attempt to reach you two times.  During this call, we will ask if you have developed any symptoms of COVID 19. If you develop any symptoms (ie: fever, flu-like symptoms, shortness of breath, cough etc.) before then, please call [phone].  If you test positive for Covid 19 in the 2 weeks post procedure, please call and report this information to [place].    If any biopsies were taken you will be contacted by phone or by letter within the next 1-3 weeks.  Please call us at [phone] if you have not heard about the biopsies in 3 weeks.    SIGNATURES/CONFIDENTIALITY: You and/or your care partner have signed paperwork which will be entered into your electronic medical record.  These signatures attest to the fact that that the information above on your After Visit Summary has been reviewed and is understood.  Full responsibility of the confidentiality of this discharge information lies with you and/or your care-partner.

## 2019-09-14 NOTE — Progress Notes (Signed)
pt tolerated well. VSS. awake and to recovery. Report given to RN.  

## 2019-09-14 NOTE — Op Note (Signed)
[facility] Patient Name: Jack Pitts Procedure Date: 09/14/2019 8:24 AM MRN: [id number] Endoscopist: Justice Britain , MD Age: 64 Referring MD:  Date of Birth: [birth year] Gender: Male Account #: [id number] Procedure:                Colonoscopy Indications:              Surveillance: Personal history of adenomatous                            polyps on last colonoscopy 5 years ago Medicines:                Monitored Anesthesia Care Procedure:                Pre-Anesthesia Assessment:                           - Prior to the procedure, a History and Physical                            was performed, and patient medications and                            allergies were reviewed. The patient's tolerance of                            previous anesthesia was also reviewed. The risks                            and benefits of the procedure and the sedation                            options and risks were discussed with the patient.                            All questions were answered, and informed consent                            was obtained. Prior Anticoagulants: The patient has                            taken no previous anticoagulant or antiplatelet                            agents. ASA Grade Assessment: III - A patient with                            severe systemic disease. After reviewing the risks                            and benefits, the patient was deemed in                            satisfactory condition to undergo the procedure.  After obtaining informed consent, the colonoscope                            was passed under direct vision. Throughout the                            procedure, the patient's blood pressure, pulse, and                            oxygen saturations were monitored continuously. The                            Colonoscope was introduced through the anus and                            advanced to the the  cecum, identified by                            appendiceal orifice and ileocecal valve. The                            colonoscopy was performed without difficulty. The                            patient tolerated the procedure. The quality of the                            bowel preparation was adequate. The ileocecal                            valve, appendiceal orifice, and rectum were                            photographed. Scope In: 8:45:16 AM Scope Out: 9:05:48 AM Scope Withdrawal Time: 0 hours 16 minutes 27 seconds  Total Procedure Duration: 0 hours 20 minutes 32 seconds  Findings:                 The digital rectal exam findings include                            hemorrhoids. Pertinent negatives include no                            palpable rectal lesions.                           The colon (entire examined portion) revealed                            moderately excessive looping.                           Three sessile polyps were found in the splenic  flexure (2) and ascending colon (1). The polyps                            were 2 to 5 mm in size. These polyps were removed                            with a cold snare. Resection and retrieval were                            complete.                           Patchy moderate mucosal changes characterized by                            granularity and small aphthous erosions were found                            in the proximal descending colon and at the splenic                            flexure. Biopsies were taken with a cold forceps                            for histology to rule out chronic colitis.                           Normal mucosa was found in the entire colon                            otherwise.                           Non-bleeding non-thrombosed internal hemorrhoids                            were found during retroflexion, during perianal                            exam and  during digital exam. The hemorrhoids were                            Grade II (internal hemorrhoids that prolapse but                            reduce spontaneously). Complications:            No immediate complications. Estimated Blood Loss:     Estimated blood loss was minimal. Impression:               - Hemorrhoids found on digital rectal exam.                           - There was significant looping of the colon.                           -  Three 2 to 5 mm polyps at the splenic flexure and                            in the ascending colon, removed with a cold snare.                            Resected and retrieved.                           - Patchy moderate mucosal changes were found in the                            proximal descending colon and at the splenic                            flexure. Biopsied.                           - Otherwise normal mucosa in the entire examined                            colon.                           - Non-bleeding non-thrombosed internal hemorrhoids. Recommendation:           - The patient will be observed post-procedure,                            until all discharge criteria are met.                           - Discharge patient to home.                           - Patient has a contact number available for                            emergencies. The signs and symptoms of potential                            delayed complications were discussed with the                            patient. Return to normal activities tomorrow.                            Written discharge instructions were provided to the                            patient.                           - High fiber diet.                           -  Use FiberCon 1-2 tablets PO daily.                           - Continue present medications.                           - Await pathology results.                           - Repeat colonoscopy in 3/5/7 years for                             surveillance based on pathology results and                            findings of adenomatous tissue.                           - The findings and recommendations were discussed                            with the patient. Justice Britain, MD 09/14/2019 9:15:28 AM

## 2019-09-14 NOTE — Progress Notes (Signed)
Vs VV  I have reviewed the patient's medical history in detail and updated the computerized patient record.   

## 2019-09-18 ENCOUNTER — Telehealth: Payer: Self-pay | Admitting: *Deleted

## 2019-09-18 NOTE — Telephone Encounter (Signed)
  Follow up Call-  Call back number 09/14/2019  Post procedure Call Back phone  # [phone]  Permission to leave phone message Yes  Some recent data might be hidden     Patient questions:  Do you have a fever, pain , or abdominal swelling? No. Pain Score  0 *  Have you tolerated food without any problems? Yes.    Have you been able to return to your normal activities? Yes.    Do you have any questions about your discharge instructions: Diet   No. Medications  No. Follow up visit  No.  Do you have questions or concerns about your Care? No.  Actions: * If pain score is 4 or above: 1. No action needed, pain <4.Have you developed a fever since your procedure? no  2.   Have you had an respiratory symptoms (SOB or cough) since your procedure? no  3.   Have you tested positive for COVID 19 since your procedure no  4.   Have you had any family members/close contacts diagnosed with the COVID 19 since your procedure?  no   If yes to any of these questions please route to Joylene John, RN and Erenest Rasher, RN

## 2019-10-09 ENCOUNTER — Ambulatory Visit: Payer: Self-pay | Admitting: General Surgery

## 2019-10-10 ENCOUNTER — Encounter: Payer: Self-pay | Admitting: Gastroenterology

## 2019-10-19 NOTE — Patient Instructions (Addendum)
DUE TO COVID-19 ONLY ONE VISITOR IS ALLOWED TO COME WITH YOU AND STAY IN THE WAITING ROOM ONLY DURING PRE OP AND PROCEDURE DAY OF SURGERY. THE 1 VISITOR MAY VISIT WITH YOU AFTER SURGERY IN YOUR PRIVATE ROOM DURING VISITING HOURS ONLY!  YOU NEED TO HAVE A COVID 19 TEST ON_7/30______ @_12 :15______, THIS TEST MUST BE DONE BEFORE SURGERY, COME  [address] [place] , [id number].  ([place]) ,  ONCE YOUR COVID TEST IS COMPLETED,  PLEASE BEGIN THE QUARANTINE INSTRUCTIONS AS OUTLINED IN YOUR HANDOUT.                Jack Pitts   Your procedure is scheduled on: 10/24/19   Report to [facility] Main  Entrance   Report to admitting at   9:30 AM     Call this number if you have problems the morning of surgery [phone]    Remember: Do not eat food after Midnight  . BRUSH YOUR TEETH MORNING OF SURGERY AND RINSE YOUR MOUTH OUT, NO CHEWING GUM CANDY OR MINTS.   You may have clear liquids until 8:30 AM   CLEAR LIQUID DIET   Foods Allowed                                                                     Foods Excluded  Coffee and tea, regular and decaf                             liquids that you cannot  Plain Jell-O any favor except red or purple                                           see through such as: Fruit ices (not with fruit pulp)                                     milk, soups, orange juice  Iced Popsicles                                    All solid food Carbonated beverages, regular and diet                                    Cranberry, grape and apple juices Sports drinks like Gatorade Lightly seasoned clear broth or consume(fat free) Sugar, honey syrup  Take these medicines the morning of surgery with A SIP OF WATER: None  Bring your mask and tubing to the hospital  DO NOT TAKE ANY DIABETIC MEDICATIONS DAY OF YOUR SURGERY   How to Manage Your Diabetes Before and After Surgery  Why is it important to control my blood sugar before  and after surgery?  Improving blood sugar levels before and after surgery helps healing and can limit problems.  A way of improving blood sugar control is eating  a healthy diet by: o  Eating less sugar and carbohydrates o  Increasing activity/exercise o  Talking with your doctor about reaching your blood sugar goals  High blood sugars (greater than 180 mg/dL) can raise your risk of infections and slow your recovery, so you will need to focus on controlling your diabetes during the weeks before surgery.  Make sure that the doctor who takes care of your diabetes knows about your planned surgery including the date and location.  How do I manage my blood sugar before surgery?  Check your blood sugar at least 4 times a day, starting 2 days before surgery, to make sure that the level is not too high or low. o Check your blood sugar the morning of your surgery when you wake up and every 2 hours until you get to the Short Stay unit.  If your blood sugar is less than 70 mg/dL, you will need to treat for low blood sugar: o Do not take insulin. o Treat a low blood sugar (less than 70 mg/dL) with  cup of clear juice (cranberry or apple), 4 glucose tablets, OR glucose gel. o Recheck blood sugar in 15 minutes after treatment (to make sure it is greater than 70 mg/dL). If your blood sugar is not greater than 70 mg/dL on recheck, call [phone] for further instructions.  Report your blood sugar to the short stay nurse when you get to Short Stay.   If you are admitted to the hospital after surgery: o Your blood sugar will be checked by the staff and you will probably be given insulin after surgery (instead of oral diabetes medicines) to make sure you have good blood sugar levels. o The goal for blood sugar control after surgery is 80-180 mg/dL.   WHAT DO I DO ABOUT MY DIABETES MEDICATION?   Do not take oral diabetes medicines (pills) the morning of surgery                          You may  not have any metal on your body including              piercings  Do not wear jewelry, , lotions, powders or deodorant                     Men may shave face and neck.   Do not bring valuables to the hospital. [place].  Contacts, dentures or bridgework may not be worn into surgery.      Patients discharged the day of surgery will not be allowed to drive home.   IF YOU ARE HAVING SURGERY AND GOING HOME THE SAME DAY, YOU MUST HAVE AN ADULT TO DRIVE YOU HOME AND BE WITH YOU FOR 24 HOURS.   YOU MAY GO HOME BY TAXI OR UBER OR ORTHERWISE, BUT AN ADULT MUST ACCOMPANY YOU HOME AND STAY WITH YOU FOR 24 HOURS.  Name and phone number of your driver:  Special Instructions: N/A              Please read over the following fact sheets you were given: _____________________________________________________________________  [facility] - Preparing for Surgery  Before surgery, you can play an important role.   Because skin is not sterile, your skin needs to be as free of germs as possible .  You can reduce  the number of germs on your skin by washing with CHG (chlorahexidine gluconate) soap before surgery.   CHG is an antiseptic cleaner which kills germs and bonds with the skin to continue killing germs even after washing. Please DO NOT use if you have an allergy to CHG or antibacterial soaps.   If your skin becomes reddened/irritated stop using the CHG and inform your nurse when you arrive at Short Stay. .  You may shave your face/neck.  Please follow these instructions carefully:  1.  Shower with CHG Soap the night before surgery and the  morning of Surgery.  2.  If you choose to wash your hair, wash your hair first as usual with your  normal  shampoo.  3.  After you shampoo, rinse your hair and body thoroughly to remove the  shampoo.                                        4.  Use CHG as you would any other liquid soap.  You can apply chg directly   to the skin and wash                       Gently with a scrungie or clean washcloth.  5.  Apply the CHG Soap to your body ONLY FROM THE NECK DOWN.   Do not use on face/ open                           Wound or open sores. Avoid contact with eyes, ears mouth and genitals (private parts).                       Wash face,  Genitals (private parts) with your normal soap.             6.  Wash thoroughly, paying special attention to the area where your surgery  will be performed.  7.  Thoroughly rinse your body with warm water from the neck down.  8.  DO NOT shower/wash with your normal soap after using and rinsing off  the CHG Soap.             9.  Pat yourself dry with a clean towel.            10.  Wear clean pajamas.            11.  Place clean sheets on your bed the night of your first shower and do not  sleep with pets. Day of Surgery : Do not apply any lotions/deodorants the morning of surgery.  Please wear clean clothes to the hospital/surgery center.  FAILURE TO FOLLOW THESE INSTRUCTIONS MAY RESULT IN THE CANCELLATION OF YOUR SURGERY PATIENT SIGNATURE_________________________________  NURSE SIGNATURE__________________________________  ________________________________________________________________________

## 2019-10-20 ENCOUNTER — Other Ambulatory Visit (HOSPITAL_COMMUNITY)

## 2019-10-20 ENCOUNTER — Encounter (HOSPITAL_COMMUNITY)
Admission: RE | Admit: 2019-10-20 | Discharge: 2019-10-20 | Disposition: A | Discharge status: Home / Self Care | Source: Ambulatory Visit | Attending: General Surgery | Admitting: General Surgery

## 2019-10-24 ENCOUNTER — Ambulatory Visit (HOSPITAL_COMMUNITY): Admission: RE | Admit: 2019-10-24 | Source: Ambulatory Visit | Admitting: General Surgery

## 2019-10-24 ENCOUNTER — Encounter (HOSPITAL_COMMUNITY): Admission: RE | Payer: Self-pay | Source: Ambulatory Visit

## 2019-10-24 SURGERY — REPAIR, HERNIA, UMBILICAL, LAPAROSCOPIC
Anesthesia: General

## 2020-02-09 ENCOUNTER — Ambulatory Visit: Payer: Self-pay | Admitting: General Surgery

## 2020-03-05 ENCOUNTER — Other Ambulatory Visit: Payer: Self-pay

## 2020-03-05 ENCOUNTER — Other Ambulatory Visit (HOSPITAL_COMMUNITY)
Admission: RE | Admit: 2020-03-05 | Discharge: 2020-03-05 | Disposition: A | Discharge status: Home / Self Care | Source: Ambulatory Visit | Attending: General Surgery | Admitting: General Surgery

## 2020-03-05 ENCOUNTER — Encounter (HOSPITAL_BASED_OUTPATIENT_CLINIC_OR_DEPARTMENT_OTHER): Payer: Self-pay | Admitting: General Surgery

## 2020-03-05 DIAGNOSIS — Z20822 Contact with and (suspected) exposure to covid-19: Secondary | ICD-10-CM | POA: Diagnosis not present

## 2020-03-05 DIAGNOSIS — Z01812 Encounter for preprocedural laboratory examination: Secondary | ICD-10-CM | POA: Insufficient documentation

## 2020-03-05 LAB — SARS CORONAVIRUS 2 (TAT 6-24 HRS): SARS Coronavirus 2: NEGATIVE

## 2020-03-05 NOTE — Progress Notes (Signed)
Spoke w/ via phone for pre-op interview--- PT Lab needs dos----  Istat and EKG             Lab results------ no COVID test ------ 03-05-2020 @ 1500 Arrive at ------- 0530 NPO after MN  Medications to take morning of surgery ----- do not take metformin morning of surgery Diabetic medication ----- n/a Patient Special Instructions ----- n/a Pre-Op special Istructions ----- n/a Patient verbalized understanding of instructions that were given at this phone interview. Patient denies shortness of breath, chest pain, fever, cough at this phone interview.

## 2020-03-07 NOTE — Anesthesia Preprocedure Evaluation (Addendum)
Anesthesia Evaluation  Patient identified by MRN, date of birth, ID band Patient awake    Reviewed: Allergy & Precautions, NPO status , Patient's Chart, lab work & pertinent test results  Airway Mallampati: II  TM Distance: >3 FB     Dental   Pulmonary former smoker,    breath sounds clear to auscultation       Cardiovascular negative cardio ROS   Rhythm:Regular Rate:Normal     Neuro/Psych    GI/Hepatic negative GI ROS, Neg liver ROS,   Endo/Other  diabetes  Renal/GU negative Renal ROS     Musculoskeletal   Abdominal   Peds  Hematology   Anesthesia Other Findings   Reproductive/Obstetrics                            Anesthesia Physical Anesthesia Plan  ASA: III  Anesthesia Plan: General   Post-op Pain Management:    Induction: Intravenous  PONV Risk Score and Plan: 3 and Ondansetron, Dexamethasone and Midazolam  Airway Management Planned: Oral ETT  Additional Equipment:   Intra-op Plan:   Post-operative Plan: Extubation in OR  Informed Consent: I have reviewed the patients History and Physical, chart, labs and discussed the procedure including the risks, benefits and alternatives for the proposed anesthesia with the patient or authorized representative who has indicated his/her understanding and acceptance.     Dental advisory given  Plan Discussed with: Anesthesiologist and CRNA  Anesthesia Plan Comments:        Anesthesia Quick Evaluation

## 2020-03-08 ENCOUNTER — Ambulatory Visit (HOSPITAL_BASED_OUTPATIENT_CLINIC_OR_DEPARTMENT_OTHER): Admitting: Anesthesiology

## 2020-03-08 ENCOUNTER — Encounter (HOSPITAL_BASED_OUTPATIENT_CLINIC_OR_DEPARTMENT_OTHER): Payer: Self-pay | Admitting: General Surgery

## 2020-03-08 ENCOUNTER — Ambulatory Visit (HOSPITAL_BASED_OUTPATIENT_CLINIC_OR_DEPARTMENT_OTHER)
Admission: RE | Admit: 2020-03-08 | Discharge: 2020-03-08 | Disposition: A | Discharge status: Home / Self Care | Source: Ambulatory Visit | Attending: General Surgery | Admitting: General Surgery

## 2020-03-08 ENCOUNTER — Other Ambulatory Visit: Payer: Self-pay

## 2020-03-08 ENCOUNTER — Encounter (HOSPITAL_BASED_OUTPATIENT_CLINIC_OR_DEPARTMENT_OTHER)
Admission: RE | Disposition: A | Discharge status: Home / Self Care | Payer: Self-pay | Source: Ambulatory Visit | Attending: General Surgery

## 2020-03-08 DIAGNOSIS — Z87891 Personal history of nicotine dependence: Secondary | ICD-10-CM | POA: Insufficient documentation

## 2020-03-08 DIAGNOSIS — Z7984 Long term (current) use of oral hypoglycemic drugs: Secondary | ICD-10-CM | POA: Insufficient documentation

## 2020-03-08 DIAGNOSIS — K429 Umbilical hernia without obstruction or gangrene: Secondary | ICD-10-CM | POA: Insufficient documentation

## 2020-03-08 DIAGNOSIS — E119 Type 2 diabetes mellitus without complications: Secondary | ICD-10-CM | POA: Insufficient documentation

## 2020-03-08 HISTORY — DX: Type 2 diabetes mellitus without complications: E11.9

## 2020-03-08 HISTORY — DX: Personal history of traumatic brain injury: Z87.820

## 2020-03-08 HISTORY — DX: Poor urinary stream: R39.12

## 2020-03-08 HISTORY — DX: Unspecified hearing loss, right ear: H91.91

## 2020-03-08 HISTORY — DX: Obstructive sleep apnea (adult) (pediatric): G47.33

## 2020-03-08 HISTORY — PX: UMBILICAL HERNIA REPAIR: SHX196

## 2020-03-08 HISTORY — DX: Umbilical hernia without obstruction or gangrene: K42.9

## 2020-03-08 HISTORY — DX: Personal history of colon polyps, unspecified: Z86.0100

## 2020-03-08 HISTORY — DX: Personal history of colonic polyps: Z86.010

## 2020-03-08 HISTORY — DX: Presence of spectacles and contact lenses: Z97.3

## 2020-03-08 LAB — POCT I-STAT, CHEM 8
BUN: 15 mg/dL (ref 8–23)
Calcium, Ion: 1.16 mmol/L (ref 1.15–1.40)
Chloride: 104 mmol/L (ref 98–111)
Creatinine, Ser: 0.9 mg/dL (ref 0.61–1.24)
Glucose, Bld: 110 mg/dL — ABNORMAL HIGH (ref 70–99)
HCT: 45 % (ref 39.0–52.0)
Hemoglobin: 15.3 g/dL (ref 13.0–17.0)
Potassium: 3.9 mmol/L (ref 3.5–5.1)
Sodium: 141 mmol/L (ref 135–145)
TCO2: 24 mmol/L (ref 22–32)

## 2020-03-08 LAB — GLUCOSE, CAPILLARY: Glucose-Capillary: 137 mg/dL — ABNORMAL HIGH (ref 70–99)

## 2020-03-08 SURGERY — REPAIR, HERNIA, UMBILICAL, LAPAROSCOPIC
Anesthesia: General | Site: Abdomen

## 2020-03-08 MED ORDER — ESMOLOL HCL 100 MG/10ML IV SOLN
INTRAVENOUS | Status: DC | PRN
  Administered 2020-03-08 (×2): 20 mg via INTRAVENOUS

## 2020-03-08 MED ORDER — LIDOCAINE 2% (20 MG/ML) 5 ML SYRINGE
INTRAMUSCULAR | Status: DC | PRN
  Administered 2020-03-08: 80 mg via INTRAVENOUS

## 2020-03-08 MED ORDER — PROPOFOL 10 MG/ML IV BOLUS
INTRAVENOUS | Status: AC
  Filled 2020-03-08: qty 40

## 2020-03-08 MED ORDER — LIDOCAINE HCL (PF) 2 % IJ SOLN
INTRAMUSCULAR | Status: AC
  Filled 2020-03-08: qty 5

## 2020-03-08 MED ORDER — DEXMEDETOMIDINE (PRECEDEX) IN NS 20 MCG/5ML (4 MCG/ML) IV SYRINGE
PREFILLED_SYRINGE | INTRAVENOUS | Status: AC
  Filled 2020-03-08: qty 5

## 2020-03-08 MED ORDER — SUCCINYLCHOLINE CHLORIDE 200 MG/10ML IV SOSY
PREFILLED_SYRINGE | INTRAVENOUS | Status: DC | PRN
  Administered 2020-03-08: 140 mg via INTRAVENOUS

## 2020-03-08 MED ORDER — ROCURONIUM BROMIDE 10 MG/ML (PF) SYRINGE
PREFILLED_SYRINGE | INTRAVENOUS | Status: AC
  Filled 2020-03-08: qty 10

## 2020-03-08 MED ORDER — ACETAMINOPHEN 500 MG PO TABS
1000.0000 mg | ORAL_TABLET | ORAL | Status: AC
  Administered 2020-03-08: 07:00:00 1000 mg via ORAL

## 2020-03-08 MED ORDER — LABETALOL HCL 5 MG/ML IV SOLN
INTRAVENOUS | Status: AC
  Filled 2020-03-08: qty 4

## 2020-03-08 MED ORDER — IBUPROFEN 800 MG PO TABS: 800.0000 mg | ORAL_TABLET | Freq: Three times a day (TID) | ORAL | 0 refills | Status: AC | PRN

## 2020-03-08 MED ORDER — MIDAZOLAM HCL 2 MG/2ML IJ SOLN
INTRAMUSCULAR | Status: AC
  Filled 2020-03-08: qty 2

## 2020-03-08 MED ORDER — KETOROLAC TROMETHAMINE 15 MG/ML IJ SOLN: 15.0000 mg | INTRAMUSCULAR | Status: DC

## 2020-03-08 MED ORDER — CEFAZOLIN SODIUM-DEXTROSE 2-4 GM/100ML-% IV SOLN
2.0000 g | INTRAVENOUS | Status: AC
  Administered 2020-03-08: 08:00:00 2 g via INTRAVENOUS

## 2020-03-08 MED ORDER — ONDANSETRON HCL 4 MG/2ML IJ SOLN
INTRAMUSCULAR | Status: DC | PRN
  Administered 2020-03-08 (×2): 4 mg via INTRAVENOUS

## 2020-03-08 MED ORDER — ONDANSETRON HCL 4 MG/2ML IJ SOLN
INTRAMUSCULAR | Status: AC
  Filled 2020-03-08: qty 2

## 2020-03-08 MED ORDER — ROCURONIUM BROMIDE 100 MG/10ML IV SOLN
INTRAVENOUS | Status: DC | PRN
  Administered 2020-03-08: 50 mg via INTRAVENOUS

## 2020-03-08 MED ORDER — LABETALOL HCL 5 MG/ML IV SOLN
INTRAVENOUS | Status: DC | PRN
  Administered 2020-03-08 (×2): 2.5 mg via INTRAVENOUS

## 2020-03-08 MED ORDER — FENTANYL CITRATE (PF) 100 MCG/2ML IJ SOLN
INTRAMUSCULAR | Status: DC | PRN
  Administered 2020-03-08 (×4): 50 ug via INTRAVENOUS

## 2020-03-08 MED ORDER — ACETAMINOPHEN 500 MG PO TABS
ORAL_TABLET | ORAL | Status: AC
  Filled 2020-03-08: qty 2

## 2020-03-08 MED ORDER — LACTATED RINGERS IV SOLN: INTRAVENOUS | Status: DC

## 2020-03-08 MED ORDER — CEFAZOLIN SODIUM-DEXTROSE 2-4 GM/100ML-% IV SOLN
INTRAVENOUS | Status: AC
  Filled 2020-03-08: qty 100

## 2020-03-08 MED ORDER — BUPIVACAINE LIPOSOME 1.3 % IJ SUSP
INTRAMUSCULAR | Status: DC | PRN
  Administered 2020-03-08: 20 mL

## 2020-03-08 MED ORDER — SUCCINYLCHOLINE CHLORIDE 200 MG/10ML IV SOSY
PREFILLED_SYRINGE | INTRAVENOUS | Status: AC
  Filled 2020-03-08: qty 10

## 2020-03-08 MED ORDER — BUPIVACAINE LIPOSOME 1.3 % IJ SUSP: 20.0000 mL | Freq: Once | INTRAMUSCULAR | Status: DC

## 2020-03-08 MED ORDER — SUGAMMADEX SODIUM 200 MG/2ML IV SOLN
INTRAVENOUS | Status: DC | PRN
  Administered 2020-03-08: 175 mg via INTRAVENOUS

## 2020-03-08 MED ORDER — BUPIVACAINE HCL 0.5 % IJ SOLN
INTRAMUSCULAR | Status: DC | PRN
  Administered 2020-03-08: 30 mL

## 2020-03-08 MED ORDER — CHLORHEXIDINE GLUCONATE CLOTH 2 % EX PADS: 6.0000 | MEDICATED_PAD | Freq: Once | CUTANEOUS | Status: DC

## 2020-03-08 MED ORDER — MIDAZOLAM HCL 5 MG/5ML IJ SOLN
INTRAMUSCULAR | Status: DC | PRN
  Administered 2020-03-08: 2 mg via INTRAVENOUS

## 2020-03-08 MED ORDER — FENTANYL CITRATE (PF) 100 MCG/2ML IJ SOLN
INTRAMUSCULAR | Status: AC
  Filled 2020-03-08: qty 2

## 2020-03-08 MED ORDER — PROPOFOL 10 MG/ML IV BOLUS
INTRAVENOUS | Status: DC | PRN
  Administered 2020-03-08: 170 mg via INTRAVENOUS
  Administered 2020-03-08: 30 mg via INTRAVENOUS

## 2020-03-08 MED ORDER — ESMOLOL HCL 100 MG/10ML IV SOLN
INTRAVENOUS | Status: AC
  Filled 2020-03-08: qty 10

## 2020-03-08 MED ORDER — ENSURE PRE-SURGERY PO LIQD: 296.0000 mL | Freq: Once | ORAL | Status: DC

## 2020-03-08 MED ORDER — KETOROLAC TROMETHAMINE 30 MG/ML IJ SOLN
INTRAMUSCULAR | Status: DC | PRN
  Administered 2020-03-08: 30 mg via INTRAVENOUS

## 2020-03-08 MED ORDER — OXYCODONE HCL 5 MG PO TABS: 5.0000 mg | ORAL_TABLET | Freq: Four times a day (QID) | ORAL | 0 refills | Status: DC | PRN

## 2020-03-08 MED ORDER — DEXAMETHASONE SODIUM PHOSPHATE 10 MG/ML IJ SOLN
INTRAMUSCULAR | Status: DC | PRN
  Administered 2020-03-08: 5 mg via INTRAVENOUS

## 2020-03-08 SURGICAL SUPPLY — 43 items
ADH SKN CLS APL DERMABOND .7 (GAUZE/BANDAGES/DRESSINGS) ×1
APL PRP STRL LF DISP 70% ISPRP (MISCELLANEOUS) ×1
APPLIER CLIP 5 13 M/L LIGAMAX5 (MISCELLANEOUS)
APR CLP MED LRG 5 ANG JAW (MISCELLANEOUS)
BINDER ABDOMINAL 12 ML 46-62 (SOFTGOODS) ×2 IMPLANT
BLADE CLIPPER SENSICLIP SURGIC (BLADE) ×2 IMPLANT
CABLE HIGH FREQUENCY MONO STRZ (ELECTRODE) ×3 IMPLANT
CHLORAPREP W/TINT 26 (MISCELLANEOUS) ×3 IMPLANT
CLIP APPLIE 5 13 M/L LIGAMAX5 (MISCELLANEOUS) IMPLANT
COVER WAND RF STERILE (DRAPES) ×3 IMPLANT
DERMABOND ADVANCED (GAUZE/BANDAGES/DRESSINGS) ×2
DERMABOND ADVANCED .7 DNX12 (GAUZE/BANDAGES/DRESSINGS) ×1 IMPLANT
DEVICE SECURE STRAP 25 ABSORB (INSTRUMENTS) ×2 IMPLANT
DRAIN CHANNEL 19F RND (DRAIN) IMPLANT
ELECT REM PT RETURN 9FT ADLT (ELECTROSURGICAL) ×3
ELECTRODE REM PT RTRN 9FT ADLT (ELECTROSURGICAL) ×1 IMPLANT
EVACUATOR SILICONE 100CC (DRAIN) IMPLANT
GLOVE BIO SURGEON STRL SZ7 (GLOVE) ×2 IMPLANT
GLOVE BIOGEL PI IND STRL 7.0 (GLOVE) ×1 IMPLANT
GLOVE BIOGEL PI IND STRL 7.5 (GLOVE) IMPLANT
GLOVE BIOGEL PI INDICATOR 7.0 (GLOVE) ×2
GLOVE BIOGEL PI INDICATOR 7.5 (GLOVE) ×4
GLOVE ECLIPSE 7.5 STRL STRAW (GLOVE) ×2 IMPLANT
GLOVE SURG SS PI 7.0 STRL IVOR (GLOVE) ×3 IMPLANT
GOWN STRL REUS W/TWL LRG LVL3 (GOWN DISPOSABLE) ×7 IMPLANT
GRASPER SUT TROCAR 14GX15 (MISCELLANEOUS) ×3 IMPLANT
IRRIG SUCT STRYKERFLOW 2 WTIP (MISCELLANEOUS) ×3
IRRIGATION SUCT STRKRFLW 2 WTP (MISCELLANEOUS) IMPLANT
KIT TURNOVER CYSTO (KITS) ×3 IMPLANT
MESH VENTRALIGHT ST 6X8 (Mesh Specialty) ×3 IMPLANT
MESH VENTRLGHT ELLIPSE 8X6XMFL (Mesh Specialty) IMPLANT
PACK BASIN DAY SURGERY FS (CUSTOM PROCEDURE TRAY) ×3 IMPLANT
SCISSORS LAP 5X35 DISP (ENDOMECHANICALS) ×3 IMPLANT
SET TUBE SMOKE EVAC HIGH FLOW (TUBING) ×3 IMPLANT
SUT ETHILON 2 0 PS N (SUTURE) IMPLANT
SUT MNCRL AB 4-0 PS2 18 (SUTURE) ×3 IMPLANT
SUT NOVA NAB GS-21 0 18 T12 DT (SUTURE) ×8 IMPLANT
SUT PDS AB 0 CT 36 (SUTURE) ×6 IMPLANT
SUT VICRYL 0 UR6 27IN ABS (SUTURE) ×2 IMPLANT
TOWEL OR 17X26 10 PK STRL BLUE (TOWEL DISPOSABLE) ×3 IMPLANT
TRAY LAPAROSCOPIC (CUSTOM PROCEDURE TRAY) ×3 IMPLANT
TROCAR BLADELESS OPT 12M 100M (ENDOMECHANICALS) ×3 IMPLANT
TROCAR BLADELESS OPT 5 100 (ENDOMECHANICALS) ×6 IMPLANT

## 2020-03-08 NOTE — Anesthesia Postprocedure Evaluation (Signed)
Anesthesia Post Note  Patient: Jack Pitts  Procedure(s) Performed: LAPAROSCOPIC UMBILICAL HERNIA REPAIR WITH MESH (N/A Abdomen)     Patient location during evaluation: PACU Anesthesia Type: General Level of consciousness: awake Pain management: pain level controlled Vital Signs Assessment: post-procedure vital signs reviewed and stable Respiratory status: spontaneous breathing Cardiovascular status: stable Postop Assessment: no apparent nausea or vomiting Anesthetic complications: no   No complications documented.  Last Vitals:  Vitals:   03/08/20 0945 03/08/20 1000  BP:  126/87  Pulse: 66 66  Resp: 18 17  Temp:    SpO2: 100% 93%    Last Pain:  Vitals:   03/08/20 1000  TempSrc:   PainSc: 2                  Avagrace Botelho

## 2020-03-08 NOTE — Op Note (Addendum)
Laparoscopic umbilical hernia repair, Procedure Note  Preoperative diagnosis: umbilical hernia without obstruction or gangrene  Postoperative diagnosis: Same   Procedure: laparoscopic umbilical hernia repair with placement of underlay mesh  Surgeon: Gurney Maxin, M.D.  Asst: Sheria Lang, M.D.  Anesthesia: Gen.   Indications for procedure: Jack Pitts is a 64 y.o. male with symptoms of abdominal pain and hernia reducible on exam.   Description of procedure: The patient was brought into the operative suite, placed supine. Anesthesia was administered with endotracheal tube. Patient was strapped in place and foot board was secured. Both arms were tucked. All pressure points were offloaded by foam padding. The patient was prepped and draped in the usual sterile fashion.  A small incision was made over the left subcostal area and 80mm trocar was placed with optical entry. Pneumoperitoneum was applied with high flow low pressure. The abdominal cavity was inspected with visualization of the umbilical hernia was omental adhesions to the hernia. 1 24mm trocar was placed in the mid left abdomen and 1 additional 23mm trocar was placed in the LLQ.  Bilateral TAP blocks were placed with Marcaine/Exparel mix.  Blunt dissection was used to remove most of the filmy adhesions with occasional electrocautery dissection. Cautery was used to provide hemostasis. The hernia was identified and was filled with omentum. It was slowly dissected free and reduced. The defect was about 2.5 cm in diameter. The falciform ligament was dissected away from the hernia with electrocautery. The peritoneal fat was dissected away from there hernia inferiorly with electrocautery. The defect was repaired with 3 interrupted 0 PDS. A 15 x 20 cm  ventralight mesh was cut to approximately 15 x 15 cm and inserted and placed beneath the fascia and peritoneum to the repair the mesh. 8 transfascial 0 novafil sutures were used to secure  the mesh in place and absorbable tackers were used to appose the mesh against the abdominal wall in all areas. The inferior peritoneal fat was tacked up to the mesh and, likewise, the falciform was tacked to the mesh.   The abdominal contents were again inspected and hemostasis was intact.  0 Vicryl was used to close the fascial defect of the 31mm trocar site using suture passer. Pneumoperitoneum was removed, all trocar were removed. All incisions were closed with 4-0 monocryl subcuticular stitch. The patient woke from anesthesia and was brought to PACU in stable condition.  Findings: 2.5 cm umbilical hernia the omental adhesions   Specimen: none  Blood loss: 15 ml  Local anesthesia:  50 ml ALPFXTK:2.4% Marcaine  Complications: None  Implant: 20 x 15 cm ventralight ST mesh  Gurney Maxin, M.D. General, Bariatric, & Minimally Invasive Surgery [facility]

## 2020-03-08 NOTE — Transfer of Care (Signed)
Immediate Anesthesia Transfer of Care Note  Patient: Jack Pitts  Procedure(s) Performed: LAPAROSCOPIC UMBILICAL HERNIA REPAIR WITH MESH (N/A Abdomen)  Patient Location: PACU  Anesthesia Type:General  Level of Consciousness: drowsy and patient cooperative  Airway & Oxygen Therapy: Patient Spontanous Breathing and Patient connected to face mask oxygen  Post-op Assessment: Report given to RN and Post -op Vital signs reviewed and stable  Post vital signs: Reviewed and stable  Last Vitals:  Vitals Value Taken Time  BP 135/81 03/08/20 0915  Temp    Pulse 66 03/08/20 0918  Resp 15 03/08/20 0918  SpO2 96 % 03/08/20 0918  Vitals shown include unvalidated device data.  Last Pain:  Vitals:   03/08/20 0559  TempSrc: Oral  PainSc: 0-No pain      Patients Stated Pain Goal: 8 (44/17/12 7871)  Complications: No complications documented.

## 2020-03-08 NOTE — H&P (Signed)
Jack Pitts is an 64 y.o. male.   Chief Complaint: abdominal pain HPI: 64 yo male with diabetes and abdominal pain. Work up was consistent with umbilical hernia.  Past Medical History:  Diagnosis Date  . Allergic rhinitis   . Hearing loss in right ear   . Heart murmur    per pt dx approx. 1998, stated asymptomatic and no work-up was done  . History of colon polyps   . History of concussion   . OSA on CPAP   . Type 2 diabetes mellitus ([place])    followed by pcp  (03-05-2020  per pt does not check blood sugar at home on regular basis)  . Umbilical hernia   . Weak urinary stream    per pt intermittant  . Wears glasses     Past Surgical History:  Procedure Laterality Date  . COLONOSCOPY  last one 09-14-2019  . TONSILLECTOMY AND ADENOIDECTOMY  child  . WRIST GANGLION EXCISION Right 1969    Family History  Problem Relation Age of Onset  . Alcohol abuse Other   . Arthritis Other   . Coronary artery disease Other   . Diabetes Other   . Hyperlipidemia Other   . Kidney disease Other   . Prostate cancer Other   . Sudden death Other   . Esophageal cancer Other   . Lymphoma Brother   . Colon cancer Neg Hx   . Pancreatic cancer Neg Hx   . Rectal cancer Neg Hx   . Stomach cancer Neg Hx   . Colon polyps Neg Hx    Social History:  reports that he quit smoking about 31 years ago. His smoking use included cigars. He quit after 2.00 years of use. He has never used smokeless tobacco. He reports current alcohol use of about 10.0 standard drinks of alcohol per week. He reports that he does not use drugs.  Allergies: No Known Allergies  Medications Prior to Admission  Medication Sig Dispense Refill  . metFORMIN (GLUCOPHAGE) 500 MG tablet Take 1 tablet (500 mg total) by mouth 2 (two) times daily with a meal. (Patient taking differently: Take 500 mg by mouth 2 (two) times daily with a meal.) 180 tablet 3  . acetaminophen (TYLENOL) 500 MG tablet Take 500 mg by mouth every 6 (six) hours  as needed for moderate pain.       Results for orders placed or performed during the hospital encounter of 03/08/20 (from the past 48 hour(s))  I-STAT, chem 8     Status: Abnormal   Collection Time: 03/08/20  6:51 AM  Result Value Ref Range   Sodium 141 135 - 145 mmol/L   Potassium 3.9 3.5 - 5.1 mmol/L   Chloride 104 98 - 111 mmol/L   BUN 15 8 - 23 mg/dL   Creatinine, Ser 0.90 0.61 - 1.24 mg/dL   Glucose, Bld 110 (H) 70 - 99 mg/dL    Comment: Glucose reference range applies only to samples taken after fasting for at least 8 hours.   Calcium, Ion 1.16 1.15 - 1.40 mmol/L   TCO2 24 22 - 32 mmol/L   Hemoglobin 15.3 13.0 - 17.0 g/dL   HCT 45.0 39.0 - 52.0 %   No results found.  Review of Systems  Constitutional: Negative for chills and fever.  HENT: Negative for hearing loss.   Respiratory: Negative for cough.   Cardiovascular: Negative for chest pain and palpitations.  Gastrointestinal: Negative for abdominal pain, nausea and vomiting.  Genitourinary: Negative  for dysuria and urgency.  Musculoskeletal: Negative for myalgias and neck pain.  Skin: Negative for rash.  Neurological: Negative for dizziness and headaches.  Hematological: Does not bruise/bleed easily.  Psychiatric/Behavioral: Negative for suicidal ideas.    Blood pressure 125/71, pulse 71, temperature 97.6 F (36.4 C), temperature source Oral, resp. rate 18, height 5\' 8"  (1.727 m), weight 88.8 kg, SpO2 100 %. Physical Exam Vitals reviewed.  Constitutional:      Appearance: He is well-developed and well-nourished.  HENT:     Head: Normocephalic and atraumatic.  Eyes:     Extraocular Movements: EOM normal.     Conjunctiva/sclera: Conjunctivae normal.     Pupils: Pupils are equal, round, and reactive to light.  Cardiovascular:     Rate and Rhythm: Normal rate and regular rhythm.  Pulmonary:     Effort: Pulmonary effort is normal.     Breath sounds: Normal breath sounds.  Abdominal:     General: Bowel sounds are  normal. There is no distension.     Palpations: Abdomen is soft.     Tenderness: There is no abdominal tenderness.     Comments: Umbilical hernia  Musculoskeletal:        General: Normal range of motion.     Cervical back: Normal range of motion and neck supple.  Skin:    General: Skin is warm and dry.  Neurological:     Mental Status: He is alert and oriented to person, place, and time.  Psychiatric:        Mood and Affect: Mood and affect normal.        Behavior: Behavior normal.      Assessment/Plan 64 yo male with umbilical hernia -lap IPOM umbilical hernia repair -planned outpatient procedure -ERAS protocol  Mickeal Skinner, MD 03/08/2020, 7:22 AM

## 2020-03-08 NOTE — Anesthesia Procedure Notes (Signed)
Procedure Name: Intubation Date/Time: 03/08/2020 7:34 AM Performed by: Gwyndolyn Saxon, CRNA Pre-anesthesia Checklist: Patient identified, Emergency Drugs available, Suction available and Patient being monitored Patient Re-evaluated:Patient Re-evaluated prior to induction Oxygen Delivery Method: Circle system utilized Preoxygenation: Pre-oxygenation with 100% oxygen Induction Type: IV induction Ventilation: Mask ventilation without difficulty and Oral airway inserted - appropriate to patient size Laryngoscope Size: Sabra Heck and 2 Grade View: Grade III Tube type: Oral Tube size: 7.5 mm Number of attempts: 1 Airway Equipment and Method: Patient positioned with wedge pillow and Stylet Placement Confirmation: positive ETCO2 and breath sounds checked- equal and bilateral Secured at: 23 cm Tube secured with: Tape Dental Injury: Teeth and Oropharynx as per pre-operative assessment  Comments: Pt with anterior airway

## 2020-03-08 NOTE — Discharge Instructions (Signed)
[facility] _______[facility]  UMBILICAL OR INGUINAL HERNIA REPAIR: POST OP INSTRUCTIONS  Always review your discharge instruction sheet given to you by the facility where your surgery was performed. IF YOU HAVE DISABILITY OR FAMILY LEAVE FORMS, YOU MUST BRING THEM TO THE OFFICE FOR PROCESSING.   DO NOT GIVE THEM TO YOUR DOCTOR.  1. A  prescription for pain medication may be given to you upon discharge.  Take your pain medication as prescribed, if needed.  If narcotic pain medicine is not needed, then you may take acetaminophen (Tylenol) or ibuprofen (Advil) as needed. 2. Take your usually prescribed medications unless otherwise directed. If you need a refill on your pain medication, please contact your pharmacy.  They will contact our office to request authorization. Prescriptions will not be filled after 5 pm or on week-ends. 3. You should follow a light diet the first 24 hours after arrival home, such as soup and crackers, etc.  Be sure to include lots of fluids daily.  Resume your normal diet the day after surgery. 4.Most patients will experience some swelling and bruising around the umbilicus or in the groin and scrotum.  Ice packs and reclining will help.  Swelling and bruising can take several days to resolve.  6. It is common to experience some constipation if taking pain medication after surgery.  Increasing fluid intake and taking a stool softener (such as Colace) will usually help or prevent this problem from occurring.  A mild laxative (Milk of Magnesia or Miralax) should be taken according to package directions if there are no bowel movements after 48 hours. 7. Unless discharge instructions indicate otherwise, you may remove your bandages 24-48 hours after surgery, and you may shower at that time.  You may have steri-strips (small skin tapes) in place directly over the incision.  These strips should be left on the skin for 7-10 days.  If your surgeon used skin glue on the  incision, you may shower in 24 hours.  The glue will flake off over the next 2-3 weeks.  Any sutures or staples will be removed at the office during your follow-up visit. 8. ACTIVITIES:  You may resume regular (light) daily activities beginning the next day--such as daily self-care, walking, climbing stairs--gradually increasing activities as tolerated.  You may have sexual intercourse when it is comfortable.  Refrain from any heavy lifting or straining until approved by your doctor.  a.You may drive when you are no longer taking prescription pain medication, you can comfortably wear a seatbelt, and you can safely maneuver your car and apply brakes. b.RETURN TO WORK:   _____________________________________________  9.You should see your doctor in the office for a follow-up appointment approximately 2-3 weeks after your surgery.  Make sure that you call for this appointment within a day or two after you arrive home to insure a convenient appointment time. 10.OTHER INSTRUCTIONS: _________________________    _____________________________________  WHEN TO CALL YOUR DOCTOR: 1. Fever over 101.0 2. Inability to urinate 3. Nausea and/or vomiting 4. Extreme swelling or bruising 5. Continued bleeding from incision. 6. Increased pain, redness, or drainage from the incision  The clinic staff is available to answer your questions during regular business hours.  Please don't hesitate to call and ask to speak to one of the nurses for clinical concerns.  If you have a medical emergency, go to the nearest emergency room or call 911.  A surgeon from [facility] is always on call at the hospital     [address] [place], [place], [place]  [address] ?  [address], [place], [place]   [address] [phone] ? [phone] ? FAX [phone] Web site: [email or web address]   Information for Discharge Teaching: EXPAREL (bupivacaine liposome injectable suspension)   Your  surgeon gave you EXPAREL(bupivacaine) in your surgical incision to help control your pain after surgery.   EXPAREL is a local anesthetic that provides pain relief by numbing the tissue around the surgical site.  EXPAREL is designed to release pain medication over time and can control pain for up to 72 hours.  Depending on how you respond to EXPAREL, you may require less pain medication during your recovery.  Possible side effects:  Temporary loss of sensation or ability to move in the area where bupivacaine was injected.  Nausea, vomiting, constipation  Rarely, numbness and tingling in your mouth or lips, lightheadedness, or anxiety may occur.  Call your doctor right away if you think you may be experiencing any of these sensations, or if you have other questions regarding possible side effects.  Follow all other discharge instructions given to you by your surgeon or nurse. Eat a healthy diet and drink plenty of water or other fluids.  If you return to the hospital for any reason within 96 hours following the administration of EXPAREL, please inform your health care providers.  May remove teal armband after Tuesday (December 21st, 2021).    Post Anesthesia Home Care Instructions  Activity: Get plenty of rest for the remainder of the day. A responsible individual must stay with you for 24 hours following the procedure.  For the next 24 hours, DO NOT: -Drive a car -Paediatric nurse -Drink alcoholic beverages -Take any medication unless instructed by your physician -Make any legal decisions or sign important papers.  Meals: Start with liquid foods such as gelatin or soup. Progress to regular foods as tolerated. Avoid greasy, spicy, heavy foods. If nausea and/or vomiting occur, drink only clear liquids until the nausea and/or vomiting subsides. Call your physician if vomiting continues.  Special Instructions/Symptoms: Your throat may feel dry or sore from the anesthesia or the  breathing tube placed in your throat during surgery. If this causes discomfort, gargle with warm salt water. The discomfort should disappear within 24 hours.  If you had a scopolamine patch placed behind your ear for the management of post- operative nausea and/or vomiting:  1. The medication in the patch is effective for 72 hours, after which it should be removed.  Wrap patch in a tissue and discard in the trash. Wash hands thoroughly with soap and water. 2. You may remove the patch earlier than 72 hours if you experience unpleasant side effects which may include dry mouth, dizziness or visual disturbances. 3. Avoid touching the patch. Wash your hands with soap and water after contact with the patch.      NO IBUPROFEN PRODUCTS (ADVIL, MOTRIN) OR ALEVE UNTIL 2:30 PM TODAY

## 2020-03-11 ENCOUNTER — Encounter (HOSPITAL_BASED_OUTPATIENT_CLINIC_OR_DEPARTMENT_OTHER): Payer: Self-pay | Admitting: General Surgery

## 2020-08-07 ENCOUNTER — Ambulatory Visit (INDEPENDENT_AMBULATORY_CARE_PROVIDER_SITE_OTHER): Payer: Self-pay | Admitting: Family Medicine

## 2020-08-07 ENCOUNTER — Encounter: Payer: Self-pay | Admitting: Family Medicine

## 2020-08-07 ENCOUNTER — Other Ambulatory Visit: Payer: Self-pay

## 2020-08-07 VITALS — BP 128/76 | HR 57 | Temp 98.1°F | Wt 177.8 lb

## 2020-08-07 DIAGNOSIS — E669 Obesity, unspecified: Secondary | ICD-10-CM

## 2020-08-07 DIAGNOSIS — Z23 Encounter for immunization: Secondary | ICD-10-CM

## 2020-08-07 DIAGNOSIS — Z0000 Encounter for general adult medical examination without abnormal findings: Secondary | ICD-10-CM

## 2020-08-07 DIAGNOSIS — E1169 Type 2 diabetes mellitus with other specified complication: Secondary | ICD-10-CM

## 2020-08-07 LAB — HEPATIC FUNCTION PANEL
ALT: 12 U/L (ref 0–53)
AST: 12 U/L (ref 0–37)
Albumin: 4.3 g/dL (ref 3.5–5.2)
Alkaline Phosphatase: 74 U/L (ref 39–117)
Bilirubin, Direct: 0.2 mg/dL (ref 0.0–0.3)
Total Bilirubin: 0.8 mg/dL (ref 0.2–1.2)
Total Protein: 6.5 g/dL (ref 6.0–8.3)

## 2020-08-07 LAB — CBC WITH DIFFERENTIAL/PLATELET
Basophils Absolute: 0 10*3/uL (ref 0.0–0.1)
Basophils Relative: 0.6 % (ref 0.0–3.0)
Eosinophils Absolute: 0.1 10*3/uL (ref 0.0–0.7)
Eosinophils Relative: 1.8 % (ref 0.0–5.0)
HCT: 42.9 % (ref 39.0–52.0)
Hemoglobin: 14.7 g/dL (ref 13.0–17.0)
Lymphocytes Relative: 24.2 % (ref 12.0–46.0)
Lymphs Abs: 1.1 10*3/uL (ref 0.7–4.0)
MCHC: 34.4 g/dL (ref 30.0–36.0)
MCV: 92.2 fl (ref 78.0–100.0)
Monocytes Absolute: 0.5 10*3/uL (ref 0.1–1.0)
Monocytes Relative: 10.8 % (ref 3.0–12.0)
Neutro Abs: 2.8 10*3/uL (ref 1.4–7.7)
Neutrophils Relative %: 62.6 % (ref 43.0–77.0)
Platelets: 197 10*3/uL (ref 150.0–400.0)
RBC: 4.65 Mil/uL (ref 4.22–5.81)
RDW: 13.5 % (ref 11.5–15.5)
WBC: 4.5 10*3/uL (ref 4.0–10.5)

## 2020-08-07 LAB — BASIC METABOLIC PANEL
BUN: 12 mg/dL (ref 6–23)
CO2: 31 mEq/L (ref 19–32)
Calcium: 9.6 mg/dL (ref 8.4–10.5)
Chloride: 102 mEq/L (ref 96–112)
Creatinine, Ser: 0.9 mg/dL (ref 0.40–1.50)
GFR: 89.85 mL/min (ref >60.00)
Glucose, Bld: 101 mg/dL — ABNORMAL HIGH (ref 70–99)
Potassium: 4.7 mEq/L (ref 3.5–5.1)
Sodium: 140 mEq/L (ref 135–145)

## 2020-08-07 LAB — MICROALBUMIN / CREATININE URINE RATIO
Creatinine,U: 84 mg/dL
Microalb Creat Ratio: 0.8 mg/g (ref 0.0–30.0)
Microalb, Ur: <0.7 mg/dL (ref 0.0–1.9)

## 2020-08-07 LAB — T3, FREE: T3, Free: 2.8 pg/mL (ref 2.3–4.2)

## 2020-08-07 LAB — PSA: PSA: 1.29 ng/mL (ref 0.10–4.00)

## 2020-08-07 LAB — T4, FREE: Free T4: 0.63 ng/dL (ref 0.60–1.60)

## 2020-08-07 LAB — LIPID PANEL
Cholesterol: 127 mg/dL (ref 0–200)
HDL: 51 mg/dL (ref >39.00)
LDL Cholesterol: 62 mg/dL (ref 0–99)
NonHDL: 76.2
Total CHOL/HDL Ratio: 2
Triglycerides: 69 mg/dL (ref 0.0–149.0)
VLDL: 13.8 mg/dL (ref 0.0–40.0)

## 2020-08-07 LAB — HEMOGLOBIN A1C: Hgb A1c MFr Bld: 5.9 % (ref 4.6–6.5)

## 2020-08-07 LAB — TSH: TSH: 1.81 u[IU]/mL (ref 0.35–4.50)

## 2020-08-07 MED ORDER — METFORMIN HCL 500 MG PO TABS
500.0000 mg | ORAL_TABLET | Freq: Two times a day (BID) | ORAL | 3 refills | Status: DC
Start: 2020-08-07 — End: 2021-01-06

## 2020-08-07 NOTE — Addendum Note (Signed)
Addended by: Elmer Picker on: 08/07/2020 09:21 AM   Modules accepted: Orders

## 2020-08-07 NOTE — Progress Notes (Signed)
   Subjective:    Patient ID: Jack Pitts, male    DOB: 1956/01/28, 65 y.o.   MRN: [id number]  HPI Here for a well exam. He feels fine. He has lost 60 lbs in the past year by exercising and dieting. His am fasting glucoses average 110-120.    Review of Systems  Constitutional: Negative.   HENT: Negative.   Eyes: Negative.   Respiratory: Negative.   Cardiovascular: Negative.   Gastrointestinal: Negative.   Genitourinary: Negative.   Musculoskeletal: Negative.   Skin: Negative.   Neurological: Negative.   Psychiatric/Behavioral: Negative.        Objective:   Physical Exam Constitutional:      General: He is not in acute distress.    Appearance: Normal appearance. He is well-developed. He is not diaphoretic.  HENT:     Head: Normocephalic and atraumatic.     Right Ear: External ear normal.     Left Ear: External ear normal.     Nose: Nose normal.     Mouth/Throat:     Pharynx: No oropharyngeal exudate.  Eyes:     General: No scleral icterus.       Right eye: No discharge.        Left eye: No discharge.     Conjunctiva/sclera: Conjunctivae normal.     Pupils: Pupils are equal, round, and reactive to light.  Neck:     Thyroid: No thyromegaly.     Vascular: No JVD.     Trachea: No tracheal deviation.  Cardiovascular:     Rate and Rhythm: Normal rate and regular rhythm.     Heart sounds: Normal heart sounds. No murmur heard. No friction rub. No gallop.   Pulmonary:     Effort: Pulmonary effort is normal. No respiratory distress.     Breath sounds: Normal breath sounds. No wheezing or rales.  Chest:     Chest wall: No tenderness.  Abdominal:     General: Bowel sounds are normal. There is no distension.     Palpations: Abdomen is soft. There is no mass.     Tenderness: There is no abdominal tenderness. There is no guarding or rebound.  Genitourinary:    Penis: Normal. No tenderness.      Testes: Normal.     Prostate: Normal.     Rectum: Normal. Guaiac result  negative.  Musculoskeletal:        General: No tenderness. Normal range of motion.     Cervical back: Neck supple.  Lymphadenopathy:     Cervical: No cervical adenopathy.  Skin:    General: Skin is warm and dry.     Coloration: Skin is not pale.     Findings: No erythema or rash.  Neurological:     Mental Status: He is alert and oriented to person, place, and time.     Cranial Nerves: No cranial nerve deficit.     Motor: No abnormal muscle tone.     Coordination: Coordination normal.     Deep Tendon Reflexes: Reflexes are normal and symmetric. Reflexes normal.  Psychiatric:        Behavior: Behavior normal.        Thought Content: Thought content normal.        Judgment: Judgment normal.           Assessment & Plan:  Well exam. We discussed diet and exercise. Get fasting labs. Given a Prevnar 13 shot.  Alysia Penna, MD

## 2020-08-07 NOTE — Addendum Note (Signed)
Addended by: Wyvonne Lenz on: 08/07/2020 09:55 AM   Modules accepted: Orders

## 2020-10-23 LAB — HM DIABETES EYE EXAM

## 2020-10-28 ENCOUNTER — Encounter: Payer: Self-pay | Admitting: Family Medicine

## 2021-01-03 ENCOUNTER — Telehealth: Payer: Self-pay | Admitting: Family Medicine

## 2021-01-03 NOTE — Telephone Encounter (Signed)
Last refill--08/07/20--180 tabs, 3 refills---1 tab  Patient requesting Metformin tabs until 01/07/21. No future office visit scheduled

## 2021-01-03 NOTE — Telephone Encounter (Signed)
Call in Metformin #10 with no rf

## 2021-01-03 NOTE — Telephone Encounter (Signed)
Patient called after hours nurse line to request enough Metformin to get through til Tuesday.  He is out of medication.

## 2021-01-06 ENCOUNTER — Other Ambulatory Visit: Payer: Self-pay

## 2021-01-06 MED ORDER — METFORMIN HCL 500 MG PO TABS: 500.0000 mg | ORAL_TABLET | Freq: Two times a day (BID) | ORAL | 0 refills | Status: DC

## 2021-01-06 NOTE — Telephone Encounter (Signed)
Pt Rx sent to his local pharmacy

## 2021-09-04 ENCOUNTER — Other Ambulatory Visit: Payer: Self-pay | Admitting: Family Medicine

## 2021-09-11 ENCOUNTER — Ambulatory Visit (INDEPENDENT_AMBULATORY_CARE_PROVIDER_SITE_OTHER): Payer: Self-pay | Admitting: Family Medicine

## 2021-09-11 ENCOUNTER — Encounter: Payer: Self-pay | Admitting: Family Medicine

## 2021-09-11 VITALS — BP 120/78 | HR 62 | Temp 98.0°F | Ht 68.0 in | Wt 178.5 lb

## 2021-09-11 DIAGNOSIS — R011 Cardiac murmur, unspecified: Secondary | ICD-10-CM

## 2021-09-11 DIAGNOSIS — N138 Other obstructive and reflux uropathy: Secondary | ICD-10-CM

## 2021-09-11 DIAGNOSIS — E1169 Type 2 diabetes mellitus with other specified complication: Secondary | ICD-10-CM

## 2021-09-11 DIAGNOSIS — E669 Obesity, unspecified: Secondary | ICD-10-CM

## 2021-09-11 DIAGNOSIS — Z23 Encounter for immunization: Secondary | ICD-10-CM

## 2021-09-11 DIAGNOSIS — N401 Enlarged prostate with lower urinary tract symptoms: Secondary | ICD-10-CM

## 2021-09-11 LAB — TSH: TSH: 1.87 u[IU]/mL (ref 0.35–5.50)

## 2021-09-11 LAB — LIPID PANEL
Cholesterol: 163 mg/dL (ref 0–200)
HDL: 61 mg/dL
LDL Cholesterol: 84 mg/dL (ref 0–99)
NonHDL: 102.4
Total CHOL/HDL Ratio: 3
Triglycerides: 91 mg/dL (ref 0.0–149.0)
VLDL: 18.2 mg/dL (ref 0.0–40.0)

## 2021-09-11 LAB — CBC WITH DIFFERENTIAL/PLATELET
Basophils Absolute: 0.1 10*3/uL (ref 0.0–0.1)
Basophils Relative: 0.9 % (ref 0.0–3.0)
Eosinophils Absolute: 0.1 10*3/uL (ref 0.0–0.7)
Eosinophils Relative: 2.2 % (ref 0.0–5.0)
HCT: 43.7 % (ref 39.0–52.0)
Hemoglobin: 14.6 g/dL (ref 13.0–17.0)
Lymphocytes Relative: 20.8 % (ref 12.0–46.0)
Lymphs Abs: 1.2 10*3/uL (ref 0.7–4.0)
MCHC: 33.5 g/dL (ref 30.0–36.0)
MCV: 95.7 fl (ref 78.0–100.0)
Monocytes Absolute: 0.6 10*3/uL (ref 0.1–1.0)
Monocytes Relative: 9.7 % (ref 3.0–12.0)
Neutro Abs: 4 10*3/uL (ref 1.4–7.7)
Neutrophils Relative %: 66.4 % (ref 43.0–77.0)
Platelets: 210 10*3/uL (ref 150.0–400.0)
RBC: 4.56 Mil/uL (ref 4.22–5.81)
RDW: 13.3 % (ref 11.5–15.5)
WBC: 6 10*3/uL (ref 4.0–10.5)

## 2021-09-11 LAB — BASIC METABOLIC PANEL WITH GFR
BUN: 10 mg/dL (ref 6–23)
CO2: 32 meq/L (ref 19–32)
Calcium: 9.8 mg/dL (ref 8.4–10.5)
Chloride: 102 meq/L (ref 96–112)
Creatinine, Ser: 1.02 mg/dL (ref 0.40–1.50)
GFR: 76.72 mL/min
Glucose, Bld: 102 mg/dL — ABNORMAL HIGH (ref 70–99)
Potassium: 3.9 meq/L (ref 3.5–5.1)
Sodium: 141 meq/L (ref 135–145)

## 2021-09-11 LAB — HEPATIC FUNCTION PANEL
ALT: 10 U/L (ref 0–53)
AST: 13 U/L (ref 0–37)
Albumin: 4.3 g/dL (ref 3.5–5.2)
Alkaline Phosphatase: 68 U/L (ref 39–117)
Bilirubin, Direct: 0.2 mg/dL (ref 0.0–0.3)
Total Bilirubin: 0.8 mg/dL (ref 0.2–1.2)
Total Protein: 6.8 g/dL (ref 6.0–8.3)

## 2021-09-11 LAB — PSA: PSA: 1.17 ng/mL (ref 0.10–4.00)

## 2021-09-11 LAB — HEMOGLOBIN A1C: Hgb A1c MFr Bld: 5.6 % (ref 4.6–6.5)

## 2021-09-11 MED ORDER — METFORMIN HCL 500 MG PO TABS
500.0000 mg | ORAL_TABLET | Freq: Two times a day (BID) | ORAL | 3 refills | Status: DC
Start: 2021-09-11 — End: 2022-09-23

## 2021-09-11 NOTE — Addendum Note (Signed)
Addended by: Wyvonne Lenz on: 09/11/2021 10:37 AM   Modules accepted: Orders

## 2021-09-11 NOTE — Progress Notes (Signed)
Subjective:    Patient ID: Jack Pitts, male    DOB: 10/10/1955, 66 y.o.   MRN: [id number]  HPI Here to follow up on issues. He feels good and has no complaints. He lost a total of 60 lbs several years ago by changing his diet, and he has been able to keep it steady since then. His am fasting glucoses have averaged in the 120's. He notes that he ran out of Metformin 2 weeks ago. His BMs are regular. He has some increased frequency of urine, but no discomfort or urgency.    Review of Systems  Constitutional: Negative.   HENT: Negative.    Eyes: Negative.   Respiratory: Negative.    Cardiovascular: Negative.   Gastrointestinal: Negative.   Genitourinary:  Positive for frequency.  Musculoskeletal: Negative.   Skin: Negative.   Neurological: Negative.   Psychiatric/Behavioral: Negative.         Objective:   Physical Exam Constitutional:      General: He is not in acute distress.    Appearance: Normal appearance. He is well-developed. He is not diaphoretic.  HENT:     Head: Normocephalic and atraumatic.     Right Ear: External ear normal.     Left Ear: External ear normal.     Nose: Nose normal.     Mouth/Throat:     Pharynx: No oropharyngeal exudate.  Eyes:     General: No scleral icterus.       Right eye: No discharge.        Left eye: No discharge.     Conjunctiva/sclera: Conjunctivae normal.     Pupils: Pupils are equal, round, and reactive to light.  Neck:     Thyroid: No thyromegaly.     Vascular: No JVD.     Trachea: No tracheal deviation.  Cardiovascular:     Rate and Rhythm: Normal rate and regular rhythm.     Heart sounds: Normal heart sounds. No murmur heard.    No friction rub. No gallop.  Pulmonary:     Effort: Pulmonary effort is normal. No respiratory distress.     Breath sounds: Normal breath sounds. No wheezing or rales.  Chest:     Chest wall: No tenderness.  Abdominal:     General: Bowel sounds are normal. There is no distension.      Palpations: Abdomen is soft. There is no mass.     Tenderness: There is no abdominal tenderness. There is no guarding or rebound.  Genitourinary:    Penis: Normal. No tenderness.      Testes: Normal.     Prostate: Normal.     Rectum: Normal. Guaiac result negative.  Musculoskeletal:        General: No tenderness. Normal range of motion.     Cervical back: Neck supple.  Lymphadenopathy:     Cervical: No cervical adenopathy.  Skin:    General: Skin is warm and dry.     Coloration: Skin is not pale.     Findings: No erythema or rash.  Neurological:     Mental Status: He is alert and oriented to person, place, and time.     Cranial Nerves: No cranial nerve deficit.     Motor: No abnormal muscle tone.     Coordination: Coordination normal.     Deep Tendon Reflexes: Reflexes are normal and symmetric. Reflexes normal.  Psychiatric:        Behavior: Behavior normal.        Thought  Content: Thought content normal.        Judgment: Judgment normal.           Assessment & Plan:  He has some mild BPH and this is stable. We discussed his diet and exercise plan to keep the weight stable. Get fasting labs to check an A1c, lipids, etc. Refilled the Metformin. We spent a total of ( 31  ) minutes reviewing records and discussing these issues.  Alysia Penna, MD

## 2022-02-10 ENCOUNTER — Other Ambulatory Visit (HOSPITAL_COMMUNITY): Payer: Self-pay | Admitting: Orthopedic Surgery

## 2022-02-10 ENCOUNTER — Ambulatory Visit (HOSPITAL_COMMUNITY)
Admission: RE | Admit: 2022-02-10 | Discharge: 2022-02-10 | Disposition: A | Discharge status: Home / Self Care | Payer: Medicare Other | Source: Ambulatory Visit | Attending: Orthopedic Surgery | Admitting: Orthopedic Surgery

## 2022-02-10 DIAGNOSIS — M79604 Pain in right leg: Secondary | ICD-10-CM | POA: Diagnosis present

## 2022-09-16 LAB — HM DIABETES EYE EXAM

## 2022-09-23 ENCOUNTER — Ambulatory Visit (INDEPENDENT_AMBULATORY_CARE_PROVIDER_SITE_OTHER): Payer: Medicare Other | Admitting: Family Medicine

## 2022-09-23 ENCOUNTER — Encounter: Payer: Self-pay | Admitting: Family Medicine

## 2022-09-23 VITALS — BP 110/76 | HR 58 | Temp 97.9°F | Ht 68.0 in | Wt 182.0 lb

## 2022-09-23 DIAGNOSIS — E119 Type 2 diabetes mellitus without complications: Secondary | ICD-10-CM

## 2022-09-23 DIAGNOSIS — Z86010 Personal history of colonic polyps: Secondary | ICD-10-CM | POA: Diagnosis not present

## 2022-09-23 DIAGNOSIS — N401 Enlarged prostate with lower urinary tract symptoms: Secondary | ICD-10-CM | POA: Diagnosis not present

## 2022-09-23 DIAGNOSIS — N138 Other obstructive and reflux uropathy: Secondary | ICD-10-CM | POA: Diagnosis not present

## 2022-09-23 DIAGNOSIS — Z7984 Long term (current) use of oral hypoglycemic drugs: Secondary | ICD-10-CM

## 2022-09-23 LAB — CBC WITH DIFFERENTIAL/PLATELET
Basophils Absolute: 0.1 10*3/uL (ref 0.0–0.1)
Basophils Relative: 0.9 % (ref 0.0–3.0)
Eosinophils Absolute: 0.1 10*3/uL (ref 0.0–0.7)
Eosinophils Relative: 1.8 % (ref 0.0–5.0)
HCT: 43 % (ref 39.0–52.0)
Hemoglobin: 14.4 g/dL (ref 13.0–17.0)
Lymphocytes Relative: 19.3 % (ref 12.0–46.0)
Lymphs Abs: 1.2 10*3/uL (ref 0.7–4.0)
MCHC: 33.4 g/dL (ref 30.0–36.0)
MCV: 94.1 fl (ref 78.0–100.0)
Monocytes Absolute: 0.6 10*3/uL (ref 0.1–1.0)
Monocytes Relative: 10.4 % (ref 3.0–12.0)
Neutro Abs: 4.1 10*3/uL (ref 1.4–7.7)
Neutrophils Relative %: 67.6 % (ref 43.0–77.0)
Platelets: 217 10*3/uL (ref 150.0–400.0)
RBC: 4.57 Mil/uL (ref 4.22–5.81)
RDW: 13.6 % (ref 11.5–15.5)
WBC: 6 10*3/uL (ref 4.0–10.5)

## 2022-09-23 LAB — LIPID PANEL
Cholesterol: 150 mg/dL (ref 0–200)
HDL: 50.4 mg/dL (ref >39.00)
LDL Cholesterol: 82 mg/dL (ref 0–99)
NonHDL: 99.99
Total CHOL/HDL Ratio: 3
Triglycerides: 90 mg/dL (ref 0.0–149.0)
VLDL: 18 mg/dL (ref 0.0–40.0)

## 2022-09-23 LAB — HEPATIC FUNCTION PANEL
ALT: 11 U/L (ref 0–53)
AST: 13 U/L (ref 0–37)
Albumin: 4.3 g/dL (ref 3.5–5.2)
Alkaline Phosphatase: 62 U/L (ref 39–117)
Bilirubin, Direct: 0.1 mg/dL (ref 0.0–0.3)
Total Bilirubin: 0.7 mg/dL (ref 0.2–1.2)
Total Protein: 6.8 g/dL (ref 6.0–8.3)

## 2022-09-23 LAB — BASIC METABOLIC PANEL
BUN: 18 mg/dL (ref 6–23)
CO2: 32 mEq/L (ref 19–32)
Calcium: 9.7 mg/dL (ref 8.4–10.5)
Chloride: 101 mEq/L (ref 96–112)
Creatinine, Ser: 0.91 mg/dL (ref 0.40–1.50)
GFR: 87.35 mL/min (ref >60.00)
Glucose, Bld: 107 mg/dL — ABNORMAL HIGH (ref 70–99)
Potassium: 4.8 mEq/L (ref 3.5–5.1)
Sodium: 139 mEq/L (ref 135–145)

## 2022-09-23 LAB — PSA: PSA: 1.3 ng/mL (ref 0.10–4.00)

## 2022-09-23 LAB — HEMOGLOBIN A1C: Hgb A1c MFr Bld: 5.5 % (ref 4.6–6.5)

## 2022-09-23 LAB — TSH: TSH: 1.71 u[IU]/mL (ref 0.35–5.50)

## 2022-09-23 MED ORDER — METFORMIN HCL 500 MG PO TABS: 500.0000 mg | ORAL_TABLET | Freq: Two times a day (BID) | ORAL | 3 refills | Status: DC

## 2022-09-23 NOTE — Progress Notes (Signed)
Subjective:    Patient ID: Jack Pitts, male    DOB: [birth year], 67 y.o.   MRN: [id number]  HPI Here to follow up on issues. He feels well in general. He watches his diet closely but he does not check glucoses at home. His BM's are regular. His notes that his urine stream is slower. He has nocturia times one. He is past due for a colonoscopy.   Review of Systems  Constitutional: Negative.   HENT: Negative.    Eyes: Negative.   Respiratory: Negative.    Cardiovascular: Negative.   Gastrointestinal: Negative.   Genitourinary: Negative.   Musculoskeletal: Negative.   Skin: Negative.   Neurological: Negative.   Psychiatric/Behavioral: Negative.         Objective:   Physical Exam Constitutional:      General: He is not in acute distress.    Appearance: Normal appearance. He is well-developed. He is not diaphoretic.  HENT:     Head: Normocephalic and atraumatic.     Right Ear: External ear normal.     Left Ear: External ear normal.     Nose: Nose normal.     Mouth/Throat:     Pharynx: No oropharyngeal exudate.  Eyes:     General: No scleral icterus.       Right eye: No discharge.        Left eye: No discharge.     Conjunctiva/sclera: Conjunctivae normal.     Pupils: Pupils are equal, round, and reactive to light.  Neck:     Thyroid: No thyromegaly.     Vascular: No JVD.     Trachea: No tracheal deviation.  Cardiovascular:     Rate and Rhythm: Normal rate and regular rhythm.     Pulses: Normal pulses.     Heart sounds: Normal heart sounds. No murmur heard.    No friction rub. No gallop.  Pulmonary:     Effort: Pulmonary effort is normal. No respiratory distress.     Breath sounds: Normal breath sounds. No wheezing or rales.  Chest:     Chest wall: No tenderness.  Abdominal:     General: Bowel sounds are normal. There is no distension.     Palpations: Abdomen is soft. There is no mass.     Tenderness: There is no abdominal tenderness. There is no guarding or  rebound.  Genitourinary:    Penis: Normal. No tenderness.      Testes: Normal.     Prostate: Normal.     Rectum: Normal. Guaiac result negative.  Musculoskeletal:        General: No tenderness. Normal range of motion.     Cervical back: Neck supple.  Lymphadenopathy:     Cervical: No cervical adenopathy.  Skin:    General: Skin is warm and dry.     Coloration: Skin is not pale.     Findings: No erythema or rash.  Neurological:     General: No focal deficit present.     Mental Status: He is alert and oriented to person, place, and time.     Cranial Nerves: No cranial nerve deficit.     Motor: No abnormal muscle tone.     Coordination: Coordination normal.     Deep Tendon Reflexes: Reflexes are normal and symmetric. Reflexes normal.  Psychiatric:        Mood and Affect: Mood normal.        Behavior: Behavior normal.        Thought Content:  Thought content normal.        Judgment: Judgment normal.           Assessment & Plan:  He seems to be doing well in general. He is beginning to have some symptoms of BPH. We will get fasting labs for an A1c, lipids, etc. He has yearly eye exams, and so far there has been no signs of retinopathy. We will set up another colonoscopy. We spent a total of (34   ) minutes reviewing records and discussing these issues.  Gershon Crane, MD

## 2022-09-25 ENCOUNTER — Encounter: Payer: Self-pay | Admitting: Gastroenterology

## 2022-10-06 ENCOUNTER — Encounter: Payer: Self-pay | Admitting: Gastroenterology

## 2022-10-06 ENCOUNTER — Ambulatory Visit (AMBULATORY_SURGERY_CENTER): Payer: Medicare Other | Admitting: *Deleted

## 2022-10-06 VITALS — Ht 68.0 in | Wt 178.0 lb

## 2022-10-06 DIAGNOSIS — Z86010 Personal history of colonic polyps: Secondary | ICD-10-CM

## 2022-10-06 MED ORDER — NA SULFATE-K SULFATE-MG SULF 17.5-3.13-1.6 GM/177ML PO SOLN: 1.0000 | Freq: Once | ORAL | 0 refills | Status: AC

## 2022-10-06 NOTE — Progress Notes (Signed)
Pt's name and DOB verified at the beginning of the pre-visit.  Pt denies any difficulty with ambulating,sitting, laying down or rolling side to side Gave both [facility] main # and MD on call # prior to instructions.  No egg or soy allergy known to patient  No issues known to pt with past sedation with any surgeries or procedures Pt denies having issues being intubated Pt has no issues moving head neck or swallowing No FH of Malignant Hyperthermia Pt is not on diet pills Pt is not on home 02  Pt is not on blood thinners  Pt denies issues with constipation  Pt is not on dialysis Pt denise any abnormal heart rhythms  Pt denies any upcoming cardiac testing Pt encouraged to use to use Singlecare or Goodrx to reduce cost  Patient's chart reviewed by Jack Pitts CNRA prior to pre-visit and patient appropriate for the [facility].  Pre-visit completed and red dot placed by patient's name on their procedure day (on provider's schedule).  . Visit by phone Pt states weight is 178lb Instructed pt why it is important to and  to call if they have any changes in health or new medications. Directed them to the # given and on instructions.   Pt states they will.  Instructions reviewed with pt and pt states understanding. Instructed to review again prior to procedure. Pt states they will.  Instructions sent by mail with coupon and by my chart

## 2022-10-13 ENCOUNTER — Ambulatory Visit (AMBULATORY_SURGERY_CENTER): Payer: Medicare Other | Admitting: Gastroenterology

## 2022-10-13 ENCOUNTER — Encounter: Payer: Self-pay | Admitting: Gastroenterology

## 2022-10-13 VITALS — BP 116/68 | HR 67 | Temp 97.7°F | Resp 12 | Ht 68.0 in | Wt 178.0 lb

## 2022-10-13 DIAGNOSIS — D127 Benign neoplasm of rectosigmoid junction: Secondary | ICD-10-CM

## 2022-10-13 DIAGNOSIS — Z86010 Personal history of colonic polyps: Secondary | ICD-10-CM | POA: Diagnosis not present

## 2022-10-13 DIAGNOSIS — Z09 Encounter for follow-up examination after completed treatment for conditions other than malignant neoplasm: Secondary | ICD-10-CM | POA: Diagnosis not present

## 2022-10-13 DIAGNOSIS — K635 Polyp of colon: Secondary | ICD-10-CM | POA: Diagnosis not present

## 2022-10-13 MED ORDER — SODIUM CHLORIDE 0.9 % IV SOLN
500.0000 mL | INTRAVENOUS | Status: DC
Start: 2022-10-13 — End: 2022-10-13

## 2022-10-13 NOTE — Progress Notes (Signed)
Uneventful anesthetic. Report to pacu rn. Vss. Care resumed by rn. 

## 2022-10-13 NOTE — Progress Notes (Signed)
Called to room to assist during endoscopic procedure.  Patient ID and intended procedure confirmed with present staff. Received instructions for my participation in the procedure from the performing physician.  

## 2022-10-13 NOTE — Progress Notes (Signed)
GASTROENTEROLOGY PROCEDURE H&P NOTE   Primary Care Physician: Nelwyn Salisbury, MD  HPI: Jack Pitts is a 67 y.o. male who presents for Colonoscopy for surveillance of previous adenomas.  Past Medical History:  Diagnosis Date   Allergic rhinitis    Hearing loss in right ear    Heart murmur    per pt dx approx. 1998, stated asymptomatic and no work-up was done   History of colon polyps    History of concussion    OSA on CPAP    Sleep apnea    Type 2 diabetes mellitus (HCC)    followed by pcp  (03-05-2020  per pt does not check blood sugar at home on regular basis)   Umbilical hernia    Weak urinary stream    per pt intermittant   Wears glasses    Past Surgical History:  Procedure Laterality Date   COLONOSCOPY  09/14/2019   per Dr. Meridee Score, tubular adenomas, repeat in 3 yrs    TONSILLECTOMY AND ADENOIDECTOMY  child   UMBILICAL HERNIA REPAIR N/A 03/08/2020   Procedure: LAPAROSCOPIC UMBILICAL HERNIA REPAIR WITH MESH;  Surgeon: Kinsinger, De Blanch, MD;  Location: [facility];  Service: General;  Laterality: N/A;   VASECTOMY     20 years ago   WRIST GANGLION EXCISION Right 1969   Current Outpatient Medications  Medication Sig Dispense Refill   acetaminophen (TYLENOL) 500 MG tablet Take 500 mg by mouth every 6 (six) hours as needed for moderate pain.  (Patient not taking: Reported on 10/06/2022)     Coenzyme Q10 (COQ10 PO) Take by mouth. Liquid     ibuprofen (ADVIL) 800 MG tablet Take 1 tablet (800 mg total) by mouth every 8 (eight) hours as needed. (Patient not taking: Reported on 10/06/2022) 30 tablet 0   metFORMIN (GLUCOPHAGE) 500 MG tablet Take 1 tablet (500 mg total) by mouth 2 (two) times daily with a meal. 180 tablet 3   psyllium (METAMUCIL) 58.6 % packet Take 1 packet by mouth daily.     Current Facility-Administered Medications  Medication Dose Route Frequency Provider Last Rate Last Admin   0.9 %  sodium chloride infusion  500 mL Intravenous  Continuous Mansouraty, Netty Starring., MD        Current Outpatient Medications:    acetaminophen (TYLENOL) 500 MG tablet, Take 500 mg by mouth every 6 (six) hours as needed for moderate pain.  (Patient not taking: Reported on 10/06/2022), Disp: , Rfl:    Coenzyme Q10 (COQ10 PO), Take by mouth. Liquid, Disp: , Rfl:    ibuprofen (ADVIL) 800 MG tablet, Take 1 tablet (800 mg total) by mouth every 8 (eight) hours as needed. (Patient not taking: Reported on 10/06/2022), Disp: 30 tablet, Rfl: 0   metFORMIN (GLUCOPHAGE) 500 MG tablet, Take 1 tablet (500 mg total) by mouth 2 (two) times daily with a meal., Disp: 180 tablet, Rfl: 3   psyllium (METAMUCIL) 58.6 % packet, Take 1 packet by mouth daily., Disp: , Rfl:   Current Facility-Administered Medications:    0.9 %  sodium chloride infusion, 500 mL, Intravenous, Continuous, Mansouraty, Netty Starring., MD No Known Allergies Family History  Problem Relation Age of Onset   Lymphoma Brother    Alcohol abuse Other    Arthritis Other    Coronary artery disease Other    Diabetes Other    Hyperlipidemia Other    Kidney disease Other    Prostate cancer Other    Sudden death Other  Esophageal cancer Other    Colon cancer Neg Hx    Pancreatic cancer Neg Hx    Rectal cancer Neg Hx    Stomach cancer Neg Hx    Colon polyps Neg Hx    Ulcerative colitis Neg Hx    Social History   Socioeconomic History   Marital status: Married    Spouse name: Not on file   Number of children: Not on file   Years of education: Not on file   Highest education level: Not on file  Occupational History   Not on file  Tobacco Use   Smoking status: Former    Types: Cigars    Quit date: 1990    Years since quitting: 34.5   Smokeless tobacco: Never  Vaping Use   Vaping status: Never Used  Substance and Sexual Activity   Alcohol use: Yes    Alcohol/week: 10.0 standard drinks of alcohol    Types: 10 Standard drinks or equivalent per week    Comment: 1-2 drinks per day  wine beer or liquor   Drug use: No    Types: Marijuana    Comment: hx age 77s   Sexual activity: Not on file    Comment: vasectomy  Other Topics Concern   Not on file  Social History Narrative   Married   Regular exercise- no   Social Determinants of Health   Financial Resource Strain: Not on file  Food Insecurity: Not on file  Transportation Needs: Not on file  Physical Activity: Not on file  Stress: Not on file  Social Connections: Not on file  Intimate Partner Violence: Not on file    Physical Exam: Today's Vitals   10/13/22 0933  BP: 126/82  Pulse: 69  Temp: 97.7 F (36.5 C)  TempSrc: Temporal  SpO2: 98%  Weight: 178 lb (80.7 kg)  Height: 5\' 8"  (1.727 m)   Body mass index is 27.06 kg/m. GEN: NAD EYE: Sclerae anicteric ENT: MMM CV: Non-tachycardic GI: Soft, NT/ND NEURO:  Alert & Oriented x 3  Lab Results: No results for input(s): "WBC", "HGB", "HCT", "PLT" in the last 72 hours. BMET No results for input(s): "NA", "K", "CL", "CO2", "GLUCOSE", "BUN", "CREATININE", "CALCIUM" in the last 72 hours. LFT No results for input(s): "PROT", "ALBUMIN", "AST", "ALT", "ALKPHOS", "BILITOT", "BILIDIR", "IBILI" in the last 72 hours. PT/INR No results for input(s): "LABPROT", "INR" in the last 72 hours.   Impression / Plan: This is a 67 y.o.male who presents for Colonoscopy for surveillance of previous adenomas.  The risks and benefits of endoscopic evaluation/treatment were discussed with the patient and/or family; these include but are not limited to the risk of perforation, infection, bleeding, missed lesions, lack of diagnosis, severe illness requiring hospitalization, as well as anesthesia and sedation related illnesses.  The patient's history has been reviewed, patient examined, no change in status, and deemed stable for procedure.  The patient and/or family is agreeable to proceed.    Corliss Parish, MD [facility] Office #  [phone]

## 2022-10-13 NOTE — Progress Notes (Signed)
Pt's states no medical or surgical changes since previsit or office visit. 

## 2022-10-13 NOTE — Op Note (Signed)
[facility] Patient Name: Jack Pitts Procedure Date: 10/13/2022 9:46 AM MRN: [id number] Endoscopist: Corliss Parish , MD, [id number] Age: 67 Referring MD:  Date of Birth: [birth year] Gender: Male Account #: [id number] Procedure:                Colonoscopy Indications:              Surveillance: Personal history of adenomatous                            polyps on last colonoscopy 3 years ago Medicines:                Monitored Anesthesia Care Procedure:                Pre-Anesthesia Assessment:                           - Prior to the procedure, a History and Physical                            was performed, and patient medications and                            allergies were reviewed. The patient's tolerance of                            previous anesthesia was also reviewed. The risks                            and benefits of the procedure and the sedation                            options and risks were discussed with the patient.                            All questions were answered, and informed consent                            was obtained. Prior Anticoagulants: The patient has                            taken no anticoagulant or antiplatelet agents. ASA                            Grade Assessment: II - A patient with mild systemic                            disease. After reviewing the risks and benefits,                            the patient was deemed in satisfactory condition to                            undergo the procedure.  After obtaining informed consent, the colonoscope                            was passed under direct vision. Throughout the                            procedure, the patient's blood pressure, pulse, and                            oxygen saturations were monitored continuously. The                            Olympus Scope SN: [id number] was introduced through                            the anus and  advanced to the the cecum, identified                            by appendiceal orifice and ileocecal valve. The                            colonoscopy was performed without difficulty. The                            patient tolerated the procedure. The quality of the                            bowel preparation was adequate. The ileocecal                            valve, appendiceal orifice, and rectum were                            photographed. Scope In: 9:55:20 AM Scope Out: 10:10:51 AM Scope Withdrawal Time: 0 hours 11 minutes 12 seconds  Total Procedure Duration: 0 hours 15 minutes 31 seconds  Findings:                 The digital rectal exam findings include                            hemorrhoids. Pertinent negatives include no                            palpable rectal lesions.                           The colon (entire examined portion) revealed                            moderately excessive looping.                           A 3 mm polyp was found in the recto-sigmoid colon.  The polyp was sessile. The polyp was removed with a                            cold snare. Resection and retrieval were complete.                           Normal mucosa was found in the entire colon                            otherwise.                           Non-bleeding non-thrombosed internal hemorrhoids                            were found during retroflexion, during perianal                            exam and during digital exam. The hemorrhoids were                            Grade II (internal hemorrhoids that prolapse but                            reduce spontaneously). Complications:            No immediate complications. Estimated Blood Loss:     Estimated blood loss was minimal. Impression:               - Hemorrhoids found on digital rectal exam.                           - There was some looping of the colon.                           - One 3 mm polyp at  the recto-sigmoid colon,                            removed with a cold snare. Resected and retrieved.                           - Normal mucosa in the entire examined colon                            otherwise.                           - Non-bleeding non-thrombosed internal hemorrhoids. Recommendation:           - The patient will be observed post-procedure,                            until all discharge criteria are met.                           - Discharge patient to home.                           -  Patient has a contact number available for                            emergencies. The signs and symptoms of potential                            delayed complications were discussed with the                            patient. Return to normal activities tomorrow.                            Written discharge instructions were provided to the                            patient.                           - High fiber diet.                           - Use FiberCon 1-2 tablets PO daily.                           - Continue present medications.                           - Await pathology results.                           - Repeat colonoscopy in 5-7 years for surveillance                            based on pathology results and previous history of                            adenomatous colon polyps.                           - The findings and recommendations were discussed                            with the patient.                           - The findings and recommendations were discussed                            with the patient's family. Corliss Parish, MD 10/13/2022 10:14:22 AM

## 2022-10-13 NOTE — Patient Instructions (Signed)
Await pathology results.  Continue present medications. High fiber diet. Use fiberCon 1-2 tablets by mouth daily.  Handouts on polyps, hemorrhoids and high fiber diet provided.  YOU HAD AN ENDOSCOPIC PROCEDURE TODAY AT THE [facility]:   Refer to the procedure report that was given to you for any specific questions about what was found during the examination.  If the procedure report does not answer your questions, please call your gastroenterologist to clarify.  If you requested that your care partner not be given the details of your procedure findings, then the procedure report has been included in a sealed envelope for you to review at your convenience later.  YOU SHOULD EXPECT: Some feelings of bloating in the abdomen. Passage of more gas than usual.  Walking can help get rid of the air that was put into your GI tract during the procedure and reduce the bloating. If you had a lower endoscopy (such as a colonoscopy or flexible sigmoidoscopy) you may notice spotting of blood in your stool or on the toilet paper. If you underwent a bowel prep for your procedure, you may not have a normal bowel movement for a few days.  Please Note:  You might notice some irritation and congestion in your nose or some drainage.  This is from the oxygen used during your procedure.  There is no need for concern and it should clear up in a day or so.  SYMPTOMS TO REPORT IMMEDIATELY:  Following lower endoscopy (colonoscopy or flexible sigmoidoscopy):  Excessive amounts of blood in the stool  Significant tenderness or worsening of abdominal pains  Swelling of the abdomen that is new, acute  Fever of 100F or higher   For urgent or emergent issues, a gastroenterologist can be reached at any hour by calling [phone]. Do not use MyChart messaging for urgent concerns.    DIET:  We do recommend a small meal at first, but then you may proceed to your regular diet.  Drink plenty of fluids but you  should avoid alcoholic beverages for 24 hours.  ACTIVITY:  You should plan to take it easy for the rest of today and you should NOT DRIVE or use heavy machinery until tomorrow (because of the sedation medicines used during the test).    FOLLOW UP: Our staff will call the number listed on your records the next business day following your procedure.  We will call around 7:15- 8:00 am to check on you and address any questions or concerns that you may have regarding the information given to you following your procedure. If we do not reach you, we will leave a message.     If any biopsies were taken you will be contacted by phone or by letter within the next 1-3 weeks.  Please call us at [phone] if you have not heard about the biopsies in 3 weeks.    SIGNATURES/CONFIDENTIALITY: You and/or your care partner have signed paperwork which will be entered into your electronic medical record.  These signatures attest to the fact that that the information above on your After Visit Summary has been reviewed and is understood.  Full responsibility of the confidentiality of this discharge information lies with you and/or your care-partner.

## 2022-10-14 ENCOUNTER — Telehealth: Payer: Self-pay | Admitting: *Deleted

## 2022-10-14 NOTE — Telephone Encounter (Signed)
  Follow up Call-     10/13/2022    9:33 AM  Call back number  Post procedure Call Back phone  # [phone]  Permission to leave phone message Yes     Patient questions:  Do you have a fever, pain , or abdominal swelling? No. Pain Score  0 *  Have you tolerated food without any problems? Yes.    Have you been able to return to your normal activities? Yes.    Do you have any questions about your discharge instructions: Diet   No. Medications  No. Follow up visit  No.  Do you have questions or concerns about your Care? No.  Actions: * If pain score is 4 or above: No action needed, pain <4.

## 2022-10-15 ENCOUNTER — Encounter: Payer: Self-pay | Admitting: Gastroenterology

## 2023-05-26 ENCOUNTER — Telehealth: Payer: Self-pay

## 2023-05-26 DIAGNOSIS — G4730 Sleep apnea, unspecified: Secondary | ICD-10-CM

## 2023-05-26 NOTE — Telephone Encounter (Signed)
 I did the referral

## 2023-05-26 NOTE — Telephone Encounter (Signed)
 Copied from CRM [id number]. Topic: Clinical - Prescription Issue >> May 26, 2023  1:10 PM Chantha C wrote: Reason for CRM: Patient [phone] is checking on a C-pap machine request, called a few months ago. Informed patient, does not see the request for c-pap in chart. [employer] will be contacting Dr. Clent Ridges for approval, patient just spoke with them. Patient states the machine is old and unsure if it'll last, and does not want to be without a c-pap machine.

## 2023-05-26 NOTE — Telephone Encounter (Signed)
 Spoke with pt state that that he needs a new CPAP machine, pt advised that he will need to see a pulmonology for a sleep study since the last time he was seen by pulmonology was in 2017. Ok to place referral

## 2023-05-26 NOTE — Addendum Note (Signed)
 Addended by: Gershon Crane A on: 05/26/2023 05:00 PM   Modules accepted: Orders

## 2023-06-02 ENCOUNTER — Institutional Professional Consult (permissible substitution): Admitting: Sleep Medicine

## 2023-06-14 ENCOUNTER — Encounter: Payer: Self-pay | Admitting: Sleep Medicine

## 2023-06-14 ENCOUNTER — Ambulatory Visit: Admitting: Sleep Medicine

## 2023-06-14 VITALS — BP 112/70 | HR 73 | Temp 97.8°F | Ht 68.0 in | Wt 190.8 lb

## 2023-06-14 DIAGNOSIS — G4733 Obstructive sleep apnea (adult) (pediatric): Secondary | ICD-10-CM

## 2023-06-14 DIAGNOSIS — Z7984 Long term (current) use of oral hypoglycemic drugs: Secondary | ICD-10-CM | POA: Diagnosis not present

## 2023-06-14 DIAGNOSIS — Z87891 Personal history of nicotine dependence: Secondary | ICD-10-CM | POA: Diagnosis not present

## 2023-06-14 DIAGNOSIS — E119 Type 2 diabetes mellitus without complications: Secondary | ICD-10-CM

## 2023-06-14 NOTE — Progress Notes (Signed)
 Name:Jack Pitts MRN: [id number] DOB: Sep 01, 1955   CHIEF COMPLAINT:  EXCESSIVE DAYTIME SLEEPINESS   HISTORY OF PRESENT ILLNESS:  Jack Pitts is a 68 y.o. w/ a h/o OSA, DMII and who presents for reassessment of OSA. Reports that he was initially diagnosed with OSA several years ago and subsequently started on CPAP therapy. Reports using CPAP therapy every night. Patient's device does not have a modem or have compliance data. He is currently using a full face mask, which leaves significant marks on his face. Reports feeling more refreshed upon awakening with CPAP therapy. Reports nocturnal awakenings due to nocturia, however does not have difficulty falling back to sleep. Denies any significant weight changes. Admits to occasional dry mouth. Denies morning headaches, RLS symptoms, dream enactment, cataplexy, hypnagogic or hypnapompic hallucinations. Reports a family history of sleep apnea. Denies drowsy driving. Drinks 2-4 cups of coffee daily, drinks 2-3 cocktails nightly, denies tobacco or illicit drug use.   Bedtime 11 pm Sleep onset 5 mins Rise time 5 am   EPWORTH SLEEP SCORE 5     No data to display          PAST MEDICAL HISTORY :   has a past medical history of Allergic rhinitis, Hearing loss in right ear, Heart murmur, History of colon polyps, History of concussion, OSA on CPAP, Sleep apnea, Type 2 diabetes mellitus (HCC), Umbilical hernia, Weak urinary stream, and Wears glasses.  has a past surgical history that includes Colonoscopy (09/14/2019); Wrist ganglion excision (Right, 1969); Tonsillectomy and adenoidectomy (child); Umbilical hernia repair (N/A, 03/08/2020); and Vasectomy. Prior to Admission medications   Medication Sig Start Date End Date Taking? Authorizing Provider  acetaminophen (TYLENOL) 500 MG tablet Take 500 mg by mouth every 6 (six) hours as needed for moderate pain.    [provider]  Coenzyme Q10 (COQ10 PO) Take by mouth. Liquid     [provider]  ibuprofen (ADVIL) 800 MG tablet Take 1 tablet (800 mg total) by mouth every 8 (eight) hours as needed. Patient not taking: Reported on 10/06/2022 03/08/20   Kinsinger, De Blanch, MD  metFORMIN (GLUCOPHAGE) 500 MG tablet Take 1 tablet (500 mg total) by mouth 2 (two) times daily with a meal. 09/23/22   Nelwyn Salisbury, MD  psyllium (METAMUCIL) 58.6 % packet Take 1 packet by mouth daily.    [provider]   No Known Allergies  FAMILY HISTORY:  family history includes Alcohol abuse in an other family member; Arthritis in an other family member; Coronary artery disease in an other family member; Diabetes in an other family member; Esophageal cancer in an other family member; Hyperlipidemia in an other family member; Kidney disease in an other family member; Lymphoma in his brother; Prostate cancer in an other family member; Sudden death in an other family member. SOCIAL HISTORY:  reports that he quit smoking about 35 years ago. His smoking use included cigars. He has never used smokeless tobacco. He reports current alcohol use of about 10.0 standard drinks of alcohol per week. He reports that he does not use drugs.   Review of Systems:  Gen:  Denies  fever, sweats, chills weight loss  HEENT: Denies blurred vision, double vision, ear pain, eye pain, hearing loss, nose bleeds, sore throat Cardiac:  No dizziness, chest pain or heaviness, chest tightness,edema, No JVD Resp:   No cough, -sputum production, -shortness of breath,-wheezing, -hemoptysis,  Gi: Denies swallowing difficulty, stomach pain, nausea or vomiting, diarrhea, constipation, bowel  incontinence Gu:  Denies bladder incontinence, burning urine Ext:   Denies Joint pain, stiffness or swelling Skin: Denies  skin rash, easy bruising or bleeding or hives Endoc:  Denies polyuria, polydipsia , polyphagia or weight change Psych:   Denies depression, insomnia or hallucinations  Other:  All other systems  negative  VITAL SIGNS: BP 112/70 (BP Location: Left Arm, Patient Position: Sitting, Cuff Size: Normal)   Pulse 73   Temp 97.8 F (36.6 C) (Temporal)   Ht 5\' 8"  (1.727 m)   Wt 190 lb 12.8 oz (86.5 kg)   SpO2 98%   BMI 29.01 kg/m     Physical Examination:   General Appearance: No distress  EYES PERRLA, EOM intact.   NECK Supple, No JVD Mallampati IV Pulmonary: normal breath sounds, No wheezing.  CardiovascularNormal S1,S2.  No m/r/g.   Abdomen: Benign, Soft, non-tender. Skin:   warm, no rashes, no ecchymosis  Extremities: normal, no cyanosis, clubbing. Neuro:without focal findings,  speech normal  PSYCHIATRIC: Mood, affect within normal limits.   ASSESSMENT AND PLAN  OSA Will complete reassessment with HST. After reviewing sleep study results will complete RX for new APAP device. Discussed the consequences of untreated sleep apnea. Advised not to drive drowsy for safety of patient and others. Will follow up to review sleep study results.    DMII Stable, on current management. Following with PCP.    MEDICATION ADJUSTMENTS/LABS AND TESTS ORDERED: Recommend Sleep Study   Patient  satisfied with Plan of action and management. All questions answered  Follow up to review HST results and treatment plan.   I spent a total of 48 minutes reviewing chart data, face-to-face evaluation with the patient, counseling and coordination of care as detailed above.    Tempie Hoist, M.D.  Sleep Medicine [facility]

## 2023-06-14 NOTE — Patient Instructions (Signed)
 Jack Pitts

## 2023-07-07 ENCOUNTER — Encounter

## 2023-07-07 DIAGNOSIS — G4733 Obstructive sleep apnea (adult) (pediatric): Secondary | ICD-10-CM

## 2023-07-20 DIAGNOSIS — R0683 Snoring: Secondary | ICD-10-CM | POA: Diagnosis not present

## 2023-07-20 DIAGNOSIS — G4733 Obstructive sleep apnea (adult) (pediatric): Secondary | ICD-10-CM | POA: Diagnosis not present

## 2023-08-10 ENCOUNTER — Ambulatory Visit (INDEPENDENT_AMBULATORY_CARE_PROVIDER_SITE_OTHER): Admitting: Sleep Medicine

## 2023-08-10 ENCOUNTER — Encounter: Payer: Self-pay | Admitting: Sleep Medicine

## 2023-08-10 VITALS — BP 126/80 | HR 68 | Temp 98.0°F | Ht 68.0 in | Wt 190.8 lb

## 2023-08-10 DIAGNOSIS — Z9981 Dependence on supplemental oxygen: Secondary | ICD-10-CM

## 2023-08-10 DIAGNOSIS — E119 Type 2 diabetes mellitus without complications: Secondary | ICD-10-CM

## 2023-08-10 DIAGNOSIS — Z87891 Personal history of nicotine dependence: Secondary | ICD-10-CM | POA: Diagnosis not present

## 2023-08-10 DIAGNOSIS — G4733 Obstructive sleep apnea (adult) (pediatric): Secondary | ICD-10-CM

## 2023-08-10 NOTE — Progress Notes (Signed)
 Name:Jack Pitts MRN: [id number] DOB: October 10, 1955   CHIEF COMPLAINT:  HST F/U   HISTORY OF PRESENT ILLNESS:  Mr. Jack Pitts is a 68 y.o. w/ a h/o OSA and DMII who presents to follow up on HST results. The patient underwent HST which revealed mild to moderate OSA (AHI 10, O2 nadir 82%).    EPWORTH SLEEP SCORE    06/14/2023    2:12 PM  Results of the Epworth flowsheet  Sitting and reading 1  Watching TV 1  Sitting, inactive in a public place (e.g. a theatre or a meeting) 0  As a passenger in a car for an hour without a break 2  Lying down to rest in the afternoon when circumstances permit 1  Sitting and talking to someone 0  Sitting quietly after a lunch without alcohol 0  In a car, while stopped for a few minutes in traffic 0  Total score 5     PAST MEDICAL HISTORY :   has a past medical history of Allergic rhinitis, Hearing loss in right ear, Heart murmur, History of colon polyps, History of concussion, OSA on CPAP, Sleep apnea, Type 2 diabetes mellitus (HCC), Umbilical hernia, Weak urinary stream, and Wears glasses.  has a past surgical history that includes Colonoscopy (09/14/2019); Wrist ganglion excision (Right, 1969); Tonsillectomy and adenoidectomy (child); Umbilical hernia repair (N/A, 03/08/2020); and Vasectomy. Prior to Admission medications   Medication Sig Start Date End Date Taking? Authorizing Provider  acetaminophen  (TYLENOL ) 500 MG tablet Take 500 mg by mouth every 6 (six) hours as needed for moderate pain.   Yes [provider]  Coenzyme Q10 (COQ10 PO) Take by mouth. Liquid   Yes [provider]  ibuprofen  (ADVIL ) 800 MG tablet Take 1 tablet (800 mg total) by mouth every 8 (eight) hours as needed. 03/08/20  Yes Kinsinger, Alphonso Aschoff, MD  metFORMIN  (GLUCOPHAGE ) 500 MG tablet Take 1 tablet (500 mg total) by mouth 2 (two) times daily with a meal. 09/23/22  Yes Donley Furth, MD  Multiple Vitamins-Minerals (MENS MULTIVITAMIN PLUS PO) Take  by mouth.   Yes [provider]  PSYLLIUM PO Take 2 capsules by mouth daily.   Yes [provider]   No Known Allergies  FAMILY HISTORY:  family history includes Alcohol abuse in an other family member; Arthritis in an other family member; Coronary artery disease in an other family member; Diabetes in an other family member; Esophageal cancer in an other family member; Hyperlipidemia in an other family member; Kidney disease in an other family member; Lymphoma in his brother; Prostate cancer in an other family member; Sudden death in an other family member. SOCIAL HISTORY:  reports that he quit smoking about 35 years ago. His smoking use included cigars. He has never used smokeless tobacco. He reports current alcohol use of about 10.0 standard drinks of alcohol per week. He reports that he does not use drugs.   Review of Systems:  Gen:  Denies  fever, sweats, chills weight loss  HEENT: Denies blurred vision, double vision, ear pain, eye pain, hearing loss, nose bleeds, sore throat Cardiac:  No dizziness, chest pain or heaviness, chest tightness,edema, No JVD Resp:   No cough, -sputum production, -shortness of breath,-wheezing, -hemoptysis,  Gi: Denies swallowing difficulty, stomach pain, nausea or vomiting, diarrhea, constipation, bowel incontinence Gu:  Denies bladder incontinence, burning urine Ext:   Denies Joint pain, stiffness or swelling Skin: Denies  skin rash, easy bruising or bleeding or  hives Endoc:  Denies polyuria, polydipsia , polyphagia or weight change Psych:   Denies depression, insomnia or hallucinations  Other:  All other systems negative  VITAL SIGNS: BP 126/80 (BP Location: Right Arm, Patient Position: Sitting, Cuff Size: Normal)   Pulse 68   Temp 98 F (36.7 C) (Oral)   Ht 5\' 8"  (1.727 m)   Wt 190 lb 12.8 oz (86.5 kg)   SpO2 95%   BMI 29.01 kg/m    Physical Examination:   General Appearance: No distress  EYES PERRLA, EOM intact.   NECK  Supple, No JVD Pulmonary: normal breath sounds, No wheezing.  CardiovascularNormal S1,S2.  No m/r/g.   Abdomen: Benign, Soft, non-tender. Skin:   warm, no rashes, no ecchymosis  Extremities: normal, no cyanosis, clubbing. Neuro:without focal findings,  speech normal  PSYCHIATRIC: Mood, affect within normal limits.   ASSESSMENT AND PLAN  OSA Reviewed HST results with patient. Order for new CPAP device completed today. Discussed the consequences of untreated sleep apnea. Advised not to drive drowsy for safety of patient and others. Will follow up in 3 months.    DMII Stable, on current management. Following with PCP.    Patient  satisfied with Plan of action and management. All questions answered  I spent a total of 32 minutes reviewing chart data, face-to-face evaluation with the patient, counseling and coordination of care as detailed above.    Jack Pitts, M.D.  Sleep Medicine  [facility]

## 2023-08-10 NOTE — Patient Instructions (Signed)

## 2023-10-28 ENCOUNTER — Ambulatory Visit: Admitting: Family Medicine

## 2023-11-04 ENCOUNTER — Other Ambulatory Visit: Payer: Self-pay

## 2023-11-04 ENCOUNTER — Telehealth: Payer: Self-pay

## 2023-11-04 MED ORDER — METFORMIN HCL 500 MG PO TABS: 500.0000 mg | ORAL_TABLET | Freq: Two times a day (BID) | ORAL | 0 refills | Status: AC

## 2023-11-04 NOTE — Telephone Encounter (Signed)
 Copied from CRM [id number]. Topic: Clinical - Medication Question >> Nov 01, 2023  1:56 PM Jack Pitts wrote: Reason for CRM: pt said he need metformin  until his appt

## 2023-11-11 ENCOUNTER — Ambulatory Visit: Admitting: Sleep Medicine

## 2023-11-11 ENCOUNTER — Encounter: Payer: Self-pay | Admitting: Sleep Medicine

## 2023-11-11 VITALS — BP 130/78 | HR 72 | Temp 97.8°F | Ht 68.0 in | Wt 191.4 lb

## 2023-11-11 DIAGNOSIS — E119 Type 2 diabetes mellitus without complications: Secondary | ICD-10-CM | POA: Diagnosis not present

## 2023-11-11 DIAGNOSIS — G4733 Obstructive sleep apnea (adult) (pediatric): Secondary | ICD-10-CM | POA: Diagnosis not present

## 2023-11-11 NOTE — Patient Instructions (Addendum)

## 2023-11-11 NOTE — Progress Notes (Signed)
 Name:Jack Pitts MRN: [id number] DOB: Oct 31, 1955   CHIEF COMPLAINT:  EXCESSIVE DAYTIME SLEEPINESS   HISTORY OF PRESENT ILLNESS: Jack Pitts is a 68 y.o. OSA and DMII who presents for CPAP follow up visit. Reports using CPAP therapy every night, which is confirmed by compliance data. He is currently using the Airfit F20 FFM, which is comfortable. Reports feeling more refreshed upon awakening with CPAP therapy. Denies any air leaks or nasal congestion.     EPWORTH SLEEP SCORE    06/14/2023    2:12 PM  Results of the Epworth flowsheet  Sitting and reading 1  Watching TV 1  Sitting, inactive in a public place (e.g. a theatre or a meeting) 0  As a passenger in a car for an hour without a break 2  Lying down to rest in the afternoon when circumstances permit 1  Sitting and talking to someone 0  Sitting quietly after a lunch without alcohol 0  In a car, while stopped for a few minutes in traffic 0  Total score 5    PAST MEDICAL HISTORY :   has a past medical history of Allergic rhinitis, Hearing loss in right ear, Heart murmur, History of colon polyps, History of concussion, OSA on CPAP, Sleep apnea, Type 2 diabetes mellitus (HCC), Umbilical hernia, Weak urinary stream, and Wears glasses.  has a past surgical history that includes Colonoscopy (09/14/2019); Wrist ganglion excision (Right, 1969); Tonsillectomy and adenoidectomy (child); Umbilical hernia repair (N/A, 03/08/2020); and Vasectomy. Prior to Admission medications   Medication Sig Start Date End Date Taking? Authorizing Provider  acetaminophen  (TYLENOL ) 500 MG tablet Take 500 mg by mouth every 6 (six) hours as needed for moderate pain.    [provider]  Coenzyme Q10 (COQ10 PO) Take by mouth. Liquid    [provider]  ibuprofen  (ADVIL ) 800 MG tablet Take 1 tablet (800 mg total) by mouth every 8 (eight) hours as needed. 03/08/20   Kinsinger, Herlene Righter, MD  metFORMIN  (GLUCOPHAGE ) 500 MG tablet  Take 1 tablet (500 mg total) by mouth 2 (two) times daily with a meal. 11/04/23   Johnny Garnette LABOR, MD  Multiple Vitamins-Minerals (MENS MULTIVITAMIN PLUS PO) Take by mouth.    [provider]  PSYLLIUM PO Take 2 capsules by mouth daily.    [provider]   No Known Allergies  FAMILY HISTORY:  family history includes Alcohol abuse in an other family member; Arthritis in an other family member; Coronary artery disease in an other family member; Diabetes in an other family member; Esophageal cancer in an other family member; Hyperlipidemia in an other family member; Kidney disease in an other family member; Lymphoma in his brother; Prostate cancer in an other family member; Sudden death in an other family member. SOCIAL HISTORY:  reports that he quit smoking about 35 years ago. His smoking use included cigars. He has never used smokeless tobacco. He reports current alcohol use of about 10.0 standard drinks of alcohol per week. He reports that he does not use drugs.   Review of Systems:  Gen:  Denies  fever, sweats, chills weight loss  HEENT: Denies blurred vision, double vision, ear pain, eye pain, hearing loss, nose bleeds, sore throat Cardiac:  No dizziness, chest pain or heaviness, chest tightness,edema, No JVD Resp:   No cough, -sputum production, -shortness of breath,-wheezing, -hemoptysis,  Gi: Denies swallowing difficulty, stomach pain, nausea or vomiting, diarrhea, constipation, bowel incontinence Gu:  Denies bladder incontinence, burning  urine Ext:   Denies Joint pain, stiffness or swelling Skin: Denies  skin rash, easy bruising or bleeding or hives Endoc:  Denies polyuria, polydipsia , polyphagia or weight change Psych:   Denies depression, insomnia or hallucinations  Other:  All other systems negative  VITAL SIGNS: BP 130/78   Pulse 72   Temp 97.8 F (36.6 C)   Ht 5' 8 (1.727 m)   Wt 191 lb 6.4 oz (86.8 kg)   SpO2 97%   BMI 29.10 kg/m     Physical  Examination:   General Appearance: No distress  EYES PERRLA, EOM intact.   NECK Supple, No JVD Pulmonary: normal breath sounds, No wheezing.  CardiovascularNormal S1,S2.  No m/r/g.   Abdomen: Benign, Soft, non-tender. Skin:   warm, no rashes, no ecchymosis  Extremities: normal, no cyanosis, clubbing. Neuro:without focal findings,  speech normal  PSYCHIATRIC: Mood, affect within normal limits.   ASSESSMENT AND PLAN  OSA Patient is using and benefiting from CPAP therapy. Discussed the consequences of untreated sleep apnea. Advised not to drive drowsy for safety of patient and others. Will follow up in 6 months.    DMII Stable, on current management. Following with PCP.    Patient  satisfied with Plan of action and management. All questions answered  I spent a total of 21 minutes reviewing chart data, face-to-face evaluation with the patient, counseling and coordination of care as detailed above.    Makilah Dowda, M.D.  Sleep Medicine [facility]

## 2023-12-20 ENCOUNTER — Ambulatory Visit (INDEPENDENT_AMBULATORY_CARE_PROVIDER_SITE_OTHER): Admitting: Family Medicine

## 2023-12-20 ENCOUNTER — Encounter: Payer: Self-pay | Admitting: Family Medicine

## 2023-12-20 VITALS — BP 128/82 | HR 66 | Temp 97.9°F | Ht 68.0 in | Wt 188.8 lb

## 2023-12-20 DIAGNOSIS — Z23 Encounter for immunization: Secondary | ICD-10-CM

## 2023-12-20 DIAGNOSIS — E119 Type 2 diabetes mellitus without complications: Secondary | ICD-10-CM

## 2023-12-20 DIAGNOSIS — N138 Other obstructive and reflux uropathy: Secondary | ICD-10-CM | POA: Diagnosis not present

## 2023-12-20 DIAGNOSIS — N401 Enlarged prostate with lower urinary tract symptoms: Secondary | ICD-10-CM | POA: Diagnosis not present

## 2023-12-20 DIAGNOSIS — K4090 Unilateral inguinal hernia, without obstruction or gangrene, not specified as recurrent: Secondary | ICD-10-CM

## 2023-12-20 DIAGNOSIS — M21611 Bunion of right foot: Secondary | ICD-10-CM

## 2023-12-20 LAB — CBC WITH DIFFERENTIAL/PLATELET
Basophils Absolute: 0 K/uL (ref 0.0–0.1)
Basophils Relative: 0.7 % (ref 0.0–3.0)
Eosinophils Absolute: 0.1 K/uL (ref 0.0–0.7)
Eosinophils Relative: 1.5 % (ref 0.0–5.0)
HCT: 42.1 % (ref 39.0–52.0)
Hemoglobin: 14.3 g/dL (ref 13.0–17.0)
Lymphocytes Relative: 18 % (ref 12.0–46.0)
Lymphs Abs: 1.1 K/uL (ref 0.7–4.0)
MCHC: 33.9 g/dL (ref 30.0–36.0)
MCV: 94 fl (ref 78.0–100.0)
Monocytes Absolute: 0.7 K/uL (ref 0.1–1.0)
Monocytes Relative: 11.4 % (ref 3.0–12.0)
Neutro Abs: 4.3 K/uL (ref 1.4–7.7)
Neutrophils Relative %: 68.4 % (ref 43.0–77.0)
Platelets: 235 K/uL (ref 150.0–400.0)
RBC: 4.48 Mil/uL (ref 4.22–5.81)
RDW: 13.2 % (ref 11.5–15.5)
WBC: 6.3 K/uL (ref 4.0–10.5)

## 2023-12-20 LAB — TSH: TSH: 1.82 u[IU]/mL (ref 0.35–5.50)

## 2023-12-20 LAB — HEPATIC FUNCTION PANEL
ALT: 13 U/L (ref 0–53)
AST: 16 U/L (ref 0–37)
Albumin: 4.3 g/dL (ref 3.5–5.2)
Alkaline Phosphatase: 68 U/L (ref 39–117)
Bilirubin, Direct: 0.1 mg/dL (ref 0.0–0.3)
Total Bilirubin: 0.6 mg/dL (ref 0.2–1.2)
Total Protein: 6.7 g/dL (ref 6.0–8.3)

## 2023-12-20 LAB — LIPID PANEL
Cholesterol: 145 mg/dL (ref 0–200)
HDL: 47.5 mg/dL (ref >39.00)
LDL Cholesterol: 82 mg/dL (ref 0–99)
NonHDL: 97.56
Total CHOL/HDL Ratio: 3
Triglycerides: 77 mg/dL (ref 0.0–149.0)
VLDL: 15.4 mg/dL (ref 0.0–40.0)

## 2023-12-20 LAB — BASIC METABOLIC PANEL WITH GFR
BUN: 20 mg/dL (ref 6–23)
CO2: 31 meq/L (ref 19–32)
Calcium: 9.8 mg/dL (ref 8.4–10.5)
Chloride: 101 meq/L (ref 96–112)
Creatinine, Ser: 0.86 mg/dL (ref 0.40–1.50)
GFR: 88.96 mL/min (ref >60.00)
Glucose, Bld: 100 mg/dL — ABNORMAL HIGH (ref 70–99)
Potassium: 4.4 meq/L (ref 3.5–5.1)
Sodium: 139 meq/L (ref 135–145)

## 2023-12-20 LAB — PSA: PSA: 2.82 ng/mL (ref 0.10–4.00)

## 2023-12-20 LAB — HEMOGLOBIN A1C: Hgb A1c MFr Bld: 6 % (ref 4.6–6.5)

## 2023-12-20 NOTE — Progress Notes (Signed)
 Subjective:    Patient ID: Jack Pitts, male    DOB: Mar 18, 1956, 68 y.o.   MRN: [id number]  HPI Here to follow up on issues. He is doing well in general. He began taking dancing classes a few months ago, and now he has an area on the side of his right foot that is painful at times. He watches his diet closely. He gets yearly eye exams at the [facility]. He denies any numbness or tingling in the feet. I found an early inguinal hernia (see below) but he denies any pain from this .   Review of Systems  Constitutional: Negative.   HENT: Negative.    Eyes: Negative.   Respiratory: Negative.    Cardiovascular: Negative.   Gastrointestinal: Negative.   Genitourinary: Negative.   Musculoskeletal:  Positive for arthralgias.  Skin: Negative.   Neurological: Negative.   Psychiatric/Behavioral: Negative.         Objective:   Physical Exam Constitutional:      General: He is not in acute distress.    Appearance: Normal appearance. He is well-developed. He is not diaphoretic.  HENT:     Head: Normocephalic and atraumatic.     Right Ear: External ear normal.     Left Ear: External ear normal.     Nose: Nose normal.     Mouth/Throat:     Pharynx: No oropharyngeal exudate.  Eyes:     General: No scleral icterus.       Right eye: No discharge.        Left eye: No discharge.     Conjunctiva/sclera: Conjunctivae normal.     Pupils: Pupils are equal, round, and reactive to light.  Neck:     Thyroid : No thyromegaly.     Vascular: No JVD.     Trachea: No tracheal deviation.  Cardiovascular:     Rate and Rhythm: Normal rate and regular rhythm.     Pulses: Normal pulses.     Heart sounds: Normal heart sounds. No murmur heard.    No friction rub. No gallop.  Pulmonary:     Effort: Pulmonary effort is normal. No respiratory distress.     Breath sounds: Normal breath sounds. No wheezing or rales.  Chest:     Chest wall: No tenderness.  Abdominal:     General: Bowel sounds are normal.  There is no distension.     Palpations: Abdomen is soft. There is no mass.     Tenderness: There is no abdominal tenderness. There is no guarding or rebound.     Comments: There is a small non-tender easily reducible left direct inguinal hernia   Genitourinary:    Penis: Normal. No tenderness.      Testes: Normal.     Prostate: Normal.     Rectum: Normal. Guaiac result negative.  Musculoskeletal:        General: No tenderness. Normal range of motion.     Cervical back: Neck supple.     Comments: There is an early bunion on the right great MTP joint   Lymphadenopathy:     Cervical: No cervical adenopathy.  Skin:    General: Skin is warm and dry.     Coloration: Skin is not pale.     Findings: No erythema or rash.  Neurological:     General: No focal deficit present.     Mental Status: He is alert and oriented to person, place, and time.     Cranial Nerves: No cranial  nerve deficit.     Motor: No abnormal muscle tone.     Coordination: Coordination normal.     Deep Tendon Reflexes: Reflexes are normal and symmetric. Reflexes normal.  Psychiatric:        Mood and Affect: Mood normal.        Behavior: Behavior normal.        Thought Content: Thought content normal.        Judgment: Judgment normal.           Assessment & Plan:  He will get labs today to check lipids, an A1c, etc to follow his type 2 diabetes without complications. He has an early bunion on the right foot. He will wear comfortable shoes and follow up as needed. He has an early left inguinal hernia. He will avoid heavy lifting and we will monitor this over time. I personally spent a total of 32 minutes in the care of the patient today including getting/reviewing separately obtained history, performing a medically appropriate exam/evaluation, counseling and educating, and documenting clinical information in the EHR.  Garnette Olmsted, MD

## 2023-12-21 ENCOUNTER — Ambulatory Visit: Payer: Self-pay | Admitting: Family Medicine

## 2024-03-21 ENCOUNTER — Ambulatory Visit: Admitting: Family Medicine
